# Patient Record
Sex: Female | Born: 1965
Health system: Southern US, Community
[De-identification: ages and names within clinical notes are randomized; demographics above are authoritative.]

## PROBLEM LIST (undated history)

## (undated) DIAGNOSIS — Z79899 Other long term (current) drug therapy: Secondary | ICD-10-CM

## (undated) DIAGNOSIS — G43909 Migraine, unspecified, not intractable, without status migrainosus: Secondary | ICD-10-CM

## (undated) DIAGNOSIS — T4145XA Adverse effect of unspecified anesthetic, initial encounter: Secondary | ICD-10-CM

## (undated) DIAGNOSIS — R1031 Right lower quadrant pain: Secondary | ICD-10-CM

## (undated) DIAGNOSIS — T8859XA Other complications of anesthesia, initial encounter: Secondary | ICD-10-CM

## (undated) DIAGNOSIS — N200 Calculus of kidney: Secondary | ICD-10-CM

## (undated) DIAGNOSIS — R112 Nausea with vomiting, unspecified: Secondary | ICD-10-CM

## (undated) DIAGNOSIS — I10 Essential (primary) hypertension: Secondary | ICD-10-CM

## (undated) DIAGNOSIS — Z9889 Other specified postprocedural states: Secondary | ICD-10-CM

## (undated) DIAGNOSIS — R1032 Left lower quadrant pain: Secondary | ICD-10-CM

## (undated) HISTORY — DX: Left lower quadrant pain: R10.32

## (undated) HISTORY — DX: Migraine, unspecified, not intractable, without status migrainosus: G43.909

## (undated) HISTORY — DX: Right lower quadrant pain: R10.31

## (undated) HISTORY — DX: Essential (primary) hypertension: I10

## (undated) HISTORY — PX: ABDOMINAL HYSTERECTOMY: SHX81

## (undated) HISTORY — DX: Calculus of kidney: N20.0

## (undated) HISTORY — DX: Other long term (current) drug therapy: Z79.899

## (undated) HISTORY — PX: OTHER SURGICAL HISTORY: SHX169

---

## 2001-02-26 ENCOUNTER — Other Ambulatory Visit: Admission: RE | Admit: 2001-02-26 | Discharge: 2001-02-26 | Payer: Self-pay | Admitting: Obstetrics and Gynecology

## 2001-06-04 ENCOUNTER — Ambulatory Visit (HOSPITAL_COMMUNITY): Admission: RE | Admit: 2001-06-04 | Discharge: 2001-06-04 | Payer: Self-pay | Admitting: Family Medicine

## 2001-06-04 ENCOUNTER — Encounter: Payer: Self-pay | Admitting: Family Medicine

## 2001-06-26 ENCOUNTER — Ambulatory Visit (HOSPITAL_COMMUNITY): Admission: RE | Admit: 2001-06-26 | Discharge: 2001-06-26 | Payer: Self-pay | Admitting: Obstetrics and Gynecology

## 2001-07-05 ENCOUNTER — Inpatient Hospital Stay (HOSPITAL_COMMUNITY): Admission: RE | Admit: 2001-07-05 | Discharge: 2001-07-07 | Payer: Self-pay | Admitting: Obstetrics and Gynecology

## 2003-03-26 ENCOUNTER — Other Ambulatory Visit: Admission: RE | Admit: 2003-03-26 | Discharge: 2003-03-26 | Payer: Self-pay | Admitting: Dermatology

## 2003-04-15 ENCOUNTER — Ambulatory Visit (HOSPITAL_COMMUNITY): Admission: RE | Admit: 2003-04-15 | Discharge: 2003-04-15 | Payer: Self-pay | Admitting: Family Medicine

## 2003-04-16 ENCOUNTER — Ambulatory Visit (HOSPITAL_COMMUNITY): Admission: RE | Admit: 2003-04-16 | Discharge: 2003-04-16 | Payer: Self-pay | Admitting: Urology

## 2003-05-06 ENCOUNTER — Ambulatory Visit (HOSPITAL_COMMUNITY): Admission: RE | Admit: 2003-05-06 | Discharge: 2003-05-06 | Payer: Self-pay | Admitting: Family Medicine

## 2005-09-13 ENCOUNTER — Ambulatory Visit (HOSPITAL_COMMUNITY): Admission: RE | Admit: 2005-09-13 | Discharge: 2005-09-13 | Payer: Self-pay | Admitting: Obstetrics and Gynecology

## 2005-10-20 ENCOUNTER — Ambulatory Visit (HOSPITAL_COMMUNITY): Admission: RE | Admit: 2005-10-20 | Discharge: 2005-10-20 | Payer: Self-pay | Admitting: Family Medicine

## 2005-11-25 ENCOUNTER — Encounter: Admission: RE | Admit: 2005-11-25 | Discharge: 2005-11-25 | Payer: Self-pay | Admitting: Orthopedic Surgery

## 2006-09-25 ENCOUNTER — Ambulatory Visit (HOSPITAL_COMMUNITY): Admission: RE | Admit: 2006-09-25 | Discharge: 2006-09-25 | Payer: Self-pay | Admitting: Obstetrics & Gynecology

## 2006-10-11 ENCOUNTER — Ambulatory Visit (HOSPITAL_COMMUNITY): Admission: RE | Admit: 2006-10-11 | Discharge: 2006-10-11 | Payer: Self-pay | Admitting: Obstetrics & Gynecology

## 2006-10-11 ENCOUNTER — Encounter: Payer: Self-pay | Admitting: Obstetrics & Gynecology

## 2006-11-23 ENCOUNTER — Encounter: Admission: RE | Admit: 2006-11-23 | Discharge: 2006-11-23 | Payer: Self-pay | Admitting: Obstetrics and Gynecology

## 2007-11-26 ENCOUNTER — Encounter: Admission: RE | Admit: 2007-11-26 | Discharge: 2007-11-26 | Payer: Self-pay | Admitting: Obstetrics and Gynecology

## 2008-09-09 DIAGNOSIS — N200 Calculus of kidney: Secondary | ICD-10-CM

## 2008-09-09 HISTORY — DX: Calculus of kidney: N20.0

## 2008-09-11 ENCOUNTER — Inpatient Hospital Stay (HOSPITAL_COMMUNITY): Admission: EM | Admit: 2008-09-11 | Discharge: 2008-09-12 | Payer: Self-pay | Admitting: Emergency Medicine

## 2008-09-14 ENCOUNTER — Emergency Department (HOSPITAL_COMMUNITY): Admission: EM | Admit: 2008-09-14 | Discharge: 2008-09-14 | Payer: Self-pay | Admitting: Emergency Medicine

## 2008-12-11 ENCOUNTER — Encounter: Admission: RE | Admit: 2008-12-11 | Discharge: 2008-12-11 | Payer: Self-pay | Admitting: Obstetrics and Gynecology

## 2009-10-08 ENCOUNTER — Emergency Department (HOSPITAL_COMMUNITY): Admission: EM | Admit: 2009-10-08 | Discharge: 2009-10-08 | Payer: Self-pay | Admitting: Emergency Medicine

## 2009-10-09 ENCOUNTER — Emergency Department (HOSPITAL_COMMUNITY): Admission: EM | Admit: 2009-10-09 | Discharge: 2009-10-09 | Payer: Self-pay | Admitting: Emergency Medicine

## 2009-12-15 ENCOUNTER — Encounter: Admission: RE | Admit: 2009-12-15 | Discharge: 2009-12-15 | Payer: Self-pay | Admitting: Obstetrics and Gynecology

## 2010-01-27 ENCOUNTER — Emergency Department (HOSPITAL_COMMUNITY): Admission: EM | Admit: 2010-01-27 | Discharge: 2010-01-27 | Payer: Self-pay | Admitting: Emergency Medicine

## 2010-05-02 ENCOUNTER — Encounter: Payer: Self-pay | Admitting: Obstetrics and Gynecology

## 2010-06-23 LAB — BASIC METABOLIC PANEL
Calcium: 9.4 mg/dL (ref 8.4–10.5)
Creatinine, Ser: 0.63 mg/dL (ref 0.4–1.2)
GFR calc Af Amer: >60 mL/min (ref >60)
GFR calc non Af Amer: >60 mL/min (ref >60)
Sodium: 139 mEq/L (ref 135–145)

## 2010-06-23 LAB — CBC
Hemoglobin: 13.8 g/dL (ref 12.0–15.0)
MCHC: 34.5 g/dL (ref 30.0–36.0)
Platelets: 179 10*3/uL (ref 150–400)
RBC: 4.38 MIL/uL (ref 3.87–5.11)

## 2010-06-23 LAB — DIFFERENTIAL
Basophils Relative: 0 % (ref 0–1)
Eosinophils Absolute: 0.2 10*3/uL (ref 0.0–0.7)
Monocytes Relative: 8 % (ref 3–12)
Neutro Abs: 3.9 10*3/uL (ref 1.7–7.7)
Neutrophils Relative %: 67 % (ref 43–77)

## 2010-06-27 LAB — BASIC METABOLIC PANEL
BUN: 9 mg/dL (ref 6–23)
CO2: 24 mEq/L (ref 19–32)
Calcium: 7.6 mg/dL — ABNORMAL LOW (ref 8.4–10.5)
Calcium: 8.9 mg/dL (ref 8.4–10.5)
Creatinine, Ser: 0.88 mg/dL (ref 0.4–1.2)
GFR calc Af Amer: >60 mL/min (ref >60)
GFR calc non Af Amer: >60 mL/min (ref >60)
GFR calc non Af Amer: >60 mL/min (ref >60)
Glucose, Bld: 98 mg/dL (ref 70–99)
Glucose, Bld: 104 mg/dL — ABNORMAL HIGH (ref 70–99)
Potassium: 2.9 mEq/L — ABNORMAL LOW (ref 3.5–5.1)
Sodium: 139 mEq/L (ref 135–145)
Sodium: 134 mEq/L — ABNORMAL LOW (ref 135–145)

## 2010-06-27 LAB — URINALYSIS, ROUTINE W REFLEX MICROSCOPIC
Bilirubin Urine: NEGATIVE
Leukocytes, UA: NEGATIVE
Nitrite: NEGATIVE
Specific Gravity, Urine: 1.02 (ref 1.005–1.030)
Urobilinogen, UA: 0.2 mg/dL (ref 0.0–1.0)
pH: 6 (ref 5.0–8.0)

## 2010-06-27 LAB — URINE MICROSCOPIC-ADD ON

## 2010-06-27 LAB — CBC
Hemoglobin: 14.6 g/dL (ref 12.0–15.0)
MCH: 32 pg (ref 26.0–34.0)
MCHC: 35 g/dL (ref 30.0–36.0)
RDW: 11.9 % (ref 11.5–15.5)

## 2010-06-27 LAB — DIFFERENTIAL
Eosinophils Absolute: 0 10*3/uL (ref 0.0–0.7)
Lymphs Abs: 0.8 10*3/uL (ref 0.7–4.0)
Monocytes Relative: 17 % — ABNORMAL HIGH (ref 3–12)
Neutrophils Relative %: 59 % (ref 43–77)

## 2010-07-19 LAB — DIFFERENTIAL
Basophils Absolute: 0 10*3/uL (ref 0.0–0.1)
Basophils Absolute: 0 10*3/uL (ref 0.0–0.1)
Basophils Relative: 0 % (ref 0–1)
Lymphocytes Relative: 3 % — ABNORMAL LOW (ref 12–46)
Lymphs Abs: 0.3 10*3/uL — ABNORMAL LOW (ref 0.7–4.0)
Neutro Abs: 11.6 10*3/uL — ABNORMAL HIGH (ref 1.7–7.7)
Neutro Abs: 4.9 10*3/uL (ref 1.7–7.7)
Neutrophils Relative %: 64 % (ref 43–77)
Neutrophils Relative %: 95 % — ABNORMAL HIGH (ref 43–77)

## 2010-07-19 LAB — COMPREHENSIVE METABOLIC PANEL
Alkaline Phosphatase: 62 U/L (ref 39–117)
BUN: 13 mg/dL (ref 6–23)
CO2: 25 mEq/L (ref 19–32)
Chloride: 109 mEq/L (ref 96–112)
Glucose, Bld: 105 mg/dL — ABNORMAL HIGH (ref 70–99)
Potassium: 3.2 mEq/L — ABNORMAL LOW (ref 3.5–5.1)
Total Bilirubin: 0.6 mg/dL (ref 0.3–1.2)

## 2010-07-19 LAB — POCT I-STAT 4, (NA,K, GLUC, HGB,HCT)
Glucose, Bld: 103 mg/dL — ABNORMAL HIGH (ref 70–99)
Potassium: 3.1 mEq/L — ABNORMAL LOW (ref 3.5–5.1)

## 2010-07-19 LAB — URINALYSIS, ROUTINE W REFLEX MICROSCOPIC
Glucose, UA: NEGATIVE mg/dL
Glucose, UA: NEGATIVE mg/dL
Protein, ur: NEGATIVE mg/dL
Protein, ur: NEGATIVE mg/dL
Specific Gravity, Urine: 1.015 (ref 1.005–1.030)
pH: 7 (ref 5.0–8.0)

## 2010-07-19 LAB — BASIC METABOLIC PANEL
BUN: 8 mg/dL (ref 6–23)
Calcium: 8.5 mg/dL (ref 8.4–10.5)
Creatinine, Ser: 0.96 mg/dL (ref 0.4–1.2)
GFR calc non Af Amer: >60 mL/min (ref >60)
Potassium: 3.1 mEq/L — ABNORMAL LOW (ref 3.5–5.1)

## 2010-07-19 LAB — CBC
HCT: 35.7 % — ABNORMAL LOW (ref 36.0–46.0)
HCT: 36.2 % (ref 36.0–46.0)
Hemoglobin: 12.9 g/dL (ref 12.0–15.0)
Platelets: 165 10*3/uL (ref 150–400)
RDW: 12.2 % (ref 11.5–15.5)
WBC: 12.3 10*3/uL — ABNORMAL HIGH (ref 4.0–10.5)
WBC: 7.7 10*3/uL (ref 4.0–10.5)

## 2010-07-19 LAB — URINE CULTURE
Colony Count: NO GROWTH
Culture: NO GROWTH

## 2010-07-19 LAB — URINE MICROSCOPIC-ADD ON

## 2010-07-19 LAB — PREGNANCY, URINE: Preg Test, Ur: NEGATIVE

## 2010-08-24 NOTE — Op Note (Signed)
Angela Salas, Angela Salas               ACCOUNT NO.:  [id number]   MEDICAL RECORD NO.:  [id number]          PATIENT TYPE:  AMB   LOCATION:  DAY                           FACILITY:  [facility]   PHYSICIAN:  Lazaro Arms, M.D.   DATE OF BIRTH:  [birth year]   DATE OF PROCEDURE:  DATE OF DISCHARGE:                               OPERATIVE REPORT   PREOPERATIVE DIAGNOSES:  1. Right adnexal mass by ultrasound.  2. Right lower quadrant pain and back pain.   POSTOPERATIVE DIAGNOSES:  1. Right adnexal mass by ultrasound.  2. Right lower quadrant pain and back pain.   PROCEDURE:  Laparoscopic right salpingo-oophorectomy.   SURGEON:  Lazaro Arms, M.D.   ANESTHESIA:  General endotracheal.   FINDINGS:  Patient probably had a ruptured hemorrhagic corpus luteum  cyst from the interval change from her ultrasound done here at [facility], but it was free, immobile.  There was no evidence of malignancy or  other problems in the peritoneal cavity.  No ascites.  No studding.  They have been removed intact in total.   DESCRIPTION OF OPERATION:  Patient was taken to the operating room,  placed in the supine position, underwent general endotracheal  anesthesia.  Placed in the dorsal lithotomy position, prepped and draped  in the usual sterile fashion.  Incision was made in the umbilicus.  A  Veress needle was used and placed into the peritoneal cavity on one pass  without difficulty.  The peritoneal cavity was insufflated.  A nonbladed  11 mm trocar was used, and under direct visualization was placed into  the peritoneal cavity using the video laparoscope without difficulty  with one pass.  The incision was then made two fingerbreadths above the  pubis just to the left of the midline because of her previous midline  incision and also in the right lower quadrant, both under direct  visualization without difficulty.  The ovary was grasped.  The harmonic  scalpel was used.  The infundibulopelvic ligament was  taken down using  the harmonic scalpel.  It was taken off the pelvic sidewall without  difficulty, well away from the ureter and major vessels.  The bowel, in  addition, was not in the way.  There was no bleeding at all.  The pelvis  was irrigated.  The ovary was removed using Endocatch.  I changed to the  5 mm scope.  Then it was removed intact without difficulty.  The  incision had to be made a little bit larger in order to accommodate  removing the ovary.  Again, the pelvis was completely hemostatic.  The  instruments were removed.  The gas was allowed to escape.  The umbilical  fascia was  closed with 0 Vicryl suture.  Subcutaneous sutures were placed with 3-0  Vicryl.  All three incisions were closed to skin staples.  The patient  tolerated the procedure well.  She experienced minimal blood loss, was  taken to the recovery room in good, stable condition.  All counts were  correct x3.      Lazaro Arms, M.D.  Electronically Signed     LHE/MEDQ  D:  10/11/2006  T:  10/11/2006  Job:  [id number]

## 2010-08-24 NOTE — H&P (Signed)
Angela Salas, Angela Salas               ACCOUNT NO.:  [id number]   MEDICAL RECORD NO.:  [id number]          PATIENT TYPE:  INP   LOCATION:  A309                          FACILITY:  [facility]   PHYSICIAN:  Dennie Maizes, M.D.   DATE OF BIRTH:  11-08-1965   DATE OF ADMISSION:  09/11/2008  DATE OF DISCHARGE:  LH                              HISTORY & PHYSICAL   CHIEF COMPLAINT:  Severe left flank pain radiating to the front.   HISTORY OF PRESENT ILLNESS:  This 45 year old female is known to me from  prior evaluation.  She has a history of recurrent urolithiasis.  She was  evaluated for renal colic about 10 years ago.   She had sudden onset of severe left flank pain radiating to the front  yesterday.  She came to the emergency room for further evaluation.  She  did not have any fever, chills, voiding difficulty, or gross hematuria.  Urinalysis revealed microhematuria and further evaluation was done with  a noncontrast CT scan of abdomen and pelvis.  This revealed bilateral  nonobstructing small renal calculi.  There is a 4-mm size proximal upper  ureteral calculus on the left side with hydroureter and hydronephrosis.  This also perinephric stranding.  The patient's pain was not adequately  controlled in the emergency room.  She has been admitted to the hospital  for pain control as well as further treatment.   PAST MEDICAL HISTORY:  History of recurrent urolithiasis status post  abdominal hysterectomy with left salpingo-oophorectomy in 2003, status  post right salpingo-oophorectomy in 2008.   MEDICATIONS:  None.   ALLERGIES:  None.   EXAMINATION:  The patient is comfortable after receiving IV Dilaudid.  HEAD, EYES, EARS, NOSE, AND THROAT:  Normal.  LUNGS:  Clear to auscultation.  HEART:  Regular rate and rhythm.  No murmurs.  ABDOMEN:  Soft.  No palpable flank mass.  Moderate left costovertebral  angle tenderness was noted.  Bladder not palpable.  No suprapubic  tenderness.   ADMISSION  LABS:  Sodium 141, potassium 3.2, chloride 109, CO2 25,  glucose 105, BUN 13, creatinine 0.75, calcium 9.1.  CBC:  WBC 7.7,  hemoglobin 12.9, hematocrit 35.7.  Urinalysis:  Moderate blood, nitrate  negative, leukocyte esterase negative.  Microscopic examination:  RBC 3-  6/hpf.   IMPRESSION:  Left renal colic, left upper ureteral calculi with  obstruction, left hydronephrosis, bilateral nonobstructing small renal  calculi.   PLAN:  1. Admit the patient for IV fluids and Dilaudid PCA.  2. Strain all urine for stones.  3. Flomax for stone passage.  4. I discussed with the patient regarding management options.  If she      has persistent pain, she may need cystoscopy, left retrograde      pyelogram, ureteroscopy, stone extraction, and stent placement.  An      x-ray of the KUB area will be repeated on September 12, 2008, for stone      localization.      Dennie Maizes, M.D.  Electronically Signed     SK/MEDQ  D:  09/11/2008  T:  09/11/2008  Job:  [id number]   cc:   Donna Bernard, M.D.  Fax: [phone]

## 2010-08-24 NOTE — H&P (Signed)
NAME:  Angela Salas, Angela Salas               ACCOUNT NO.:  [id number]   MEDICAL RECORD NO.:  [id number]          PATIENT TYPE:  AMB   LOCATION:  DAY                           FACILITY:  [facility]   PHYSICIAN:  Lazaro Arms, M.D.   DATE OF BIRTH:  Aug 10, 1965   DATE OF ADMISSION:  10/11/2006  DATE OF DISCHARGE:  LH                              HISTORY & PHYSICAL   HISTORY OF PRESENT ILLNESS:  The patient is a 45 year old white female  gravida 2, para 2, status post a TAH LSO in the past who has a 4.9 x 3.9  cm right complex adnexal mass arising from the right ovary and it is  partially cystic and partially solid.  Her CA-125 is 13.4.  The patient  states that she has had pelvic pain in the right lower quadrant for  quite some time as well as her back and some occasional dyspareunia.  Because of the complex nature of it even though it has a CA-125 that is  in the benign range, because of her pain, we are going to proceed with a  laparoscopic RSO.   PAST MEDICAL HISTORY:  Negative.   PAST SURGICAL HISTORY:  TAH LSO.   PAST OBSTETRICAL HISTORY:  She has had two vaginal deliveries.   CURRENT MEDICATIONS:  None.   REVIEW OF SYSTEMS:  As per HPI.   PHYSICAL EXAMINATION:  VITAL SIGNS:  Weight is 136 pounds, blood  pressure 122/82.  HEENT:  Unremarkable.  Thyroid is normal.  LUNGS:  Clear.  HEART:  Regular rate and rhythm without murmurs, rubs, or gallops.  BREASTS:  Without mass, discharge, or skin changes.  ABDOMEN:  Benign without hepatosplenomegaly or masses.  PELVIC:  Normal external genitalia.  Vagina is pink and moist without  discharge.  There are no midline or adnexal masses palpable.  On the  right side, she is tender, however.  EXTREMITIES:  Warm with no edema.  NEUROLOGY:  Grossly intact.   IMPRESSION:  1. Complex right adnexal mass.  2. Right lower quadrant pain and right back pain.   PLAN:  The patient is admitted for a laparoscopic RSO.  She understands  the risks, benefits,  indications, alternatives, and will proceed.      Lazaro Arms, M.D.  Electronically Signed     LHE/MEDQ  D:  10/10/2006  T:  10/10/2006  Job:  [id number]

## 2010-08-24 NOTE — Group Therapy Note (Signed)
NAMEUBAH, RADKE               ACCOUNT NO.:  [id number]   MEDICAL RECORD NO.:  [id number]          PATIENT TYPE:  INP   LOCATION:  A309                          FACILITY:  [facility]   PHYSICIAN:  Dennie Maizes, M.D.   DATE OF BIRTH:  [birth year]   DATE OF PROCEDURE:  09/12/2008  DATE OF DISCHARGE:                                 PROGRESS NOTE   DIAGNOSES:  1. Left upper ureteral calculus with obstruction.  2. Left renal colic.  3. Left hydronephrosis.   Ms. Loney has not passed the stone.  She still has significant severe  pain.  She is on Dilaudid PCA with good pain control.  No history of  fevers or chills.  No voiding difficulty.  X-ray of the KUB area done  today revealed a 5-mm sized stone  on the left side at L3 level.  Examination of the abdomen is soft.  No palpable flank mass or CVA  tenderness.   PLAN:  Discussed with the patient regarding management options.  Recommended stent placement, and she was agreeable.  Cystoscopy and left  retrograde pyelogram with left ureteral stent placement will be done  today.  The obstructing stone will be treated with ESL as an outpatient.  I discussed with the patient regarding the diagnosis, operative details  ,outcome, possible risks and complications, and she has agreed for the  procedure to be done.      Dennie Maizes, M.D.  Electronically Signed     SK/MEDQ  D:  09/12/2008  T:  09/12/2008  Job:  [id number]

## 2010-08-24 NOTE — Group Therapy Note (Signed)
NAMELORALIE, MALTA               ACCOUNT NO.:  [id number]   MEDICAL RECORD NO.:  [id number]          PATIENT TYPE:  INP   LOCATION:  A309                          FACILITY:  [facility]   PHYSICIAN:  Dennie Maizes, M.D.   DATE OF BIRTH:  1965-07-25   DATE OF PROCEDURE:  09/12/2008  DATE OF DISCHARGE:                                 PROGRESS NOTE   Angela Salas has relief of pain now.  Has not passed any stone in the  urine.  A KUB done this morning revealed a 5 mm sized stone at  the L3  level.  The patient has relief of pain and she wants to cancel the stent  placement and go home.  Her potassium was 3.0 yesterday.  Her potassium  is 3.1 today.  We will replace her potassium with potassium supplements.  The patient is also nauseated.   IMPRESSION:  Left upper ureteral calculus obstruction.   PLAN:  The patient will be discharged today.   DISCHARGE MEDICATIONS:  1. Flomax 0.4 mg p.o. q.h.s.  2. Phenergan suppository 25 mg one PR q.8 hours p.r.n. nausea.  3. Percocet 5/325 one p.o. q.8 hours p.r.n. pain #20.   The patient will be seen in the office next week and if necessary ESL of  the stone will be scheduled as an outpatient.      Dennie Maizes, M.D.  Electronically Signed     SK/MEDQ  D:  09/12/2008  T:  09/12/2008  Job:  [id number]

## 2010-08-27 NOTE — Op Note (Signed)
[facility]  Patient:    Angela Salas, Angela Salas Visit Number: [id number] MRN: [id number]          Service Type: MED Location: 4A A420 01 Attending Physician:  Tilda Burrow Dictated by:   Christin Bach, M.D. Proc. Date: 07/05/01 Admit Date:  07/05/2001                             Operative Report  PREOPERATIVE DIAGNOSIS:  Symptomatic left ovarian cyst.  Severe dysmenorrhea, rule out ovarian malignancy.  POSTOPERATIVE DIAGNOSIS:  Symptomatic left ovarian cyst.  Severe dysmenorrhea, rule out ovarian malignancy.  Benign left ovarian cyst adenoma.  OPERATION/PROCEDURE:  1. Total abdominal hysterectomy.  2. Bilateral salpingo-oophorectomy.  SURGEON:  Christin Bach, M.D.  ASSISTANTGeralynn Ochs, CST  ANESTHESIA:  General.  COMPLICATIONS:  None.  ESTIMATED BLOOD LOSS:  100 cc.  FINDINGS:  A 4 cm left ovarian cyst, frozen section interpreted as benign cystadenoma.  DESCRIPTION OF PROCEDURE:  The patient was taken to the operating room and prepped and draped for lower abdominal surgery with Foley catheter placed and vaginal prepping preformed.  Vertical lower abdominal incision performed approximately 12 cm in length, from symphysis pubis and halfway to the umbilicus.  There was easy dissection through to the fascia which was opened vertically as well, and the peritoneum easily identified, elevated, opened bluntly, and inspected.  Irrigation and cytology specimen was taken and put to the side.  Bowel was packed away.  There were some small adhesions at the pelvic brim, slightly more than is usually the case, some of which required freeing up in order to pack the bowel away.  Attention to the left ovary, identified the irregular cystic nature to the left ovary.  We isolated the utero-ovarian ligament by opening the broad ligament, making a window through just below the utero-ovarian ligament and doubly cross clamping this ligament.  It was then  transected and doubly ligated.  The infundibulopelvic ligament on the left side was isolated, clamped, cut, and suture ligated.  The ureter was well out of the surgical area.  This specimen was sent down for frozen section.  It subsequently, later in the case, returned as benign cystadenoma.  Meanwhile we proceeded with hysterectomy by taking down round ligaments bilaterally, developing bladder flap anteriorly with ease, isolating the utero-ovarian ligament on the left side, doubly clamping it, and transecting it with suture ligature, using #0 chromic.  The uterus was quite mobile, small in size without obvious abnormalities.  Uterine vessels were skeletonized on either side, cross clamped with curved Heaney clamp, transected, and suture ligated with #0 chromic.  Upper and lower cardinal ligaments were taken down in serial fashion with easy removal of the cardinal ligamental attachments and suture ligature with #0 chromic.  A stab incision in the anterior cervical vaginal fornix was performed and the cervix amputated off of the vaginal cuff.  Aldrich stitches were placed in each lateral vaginal angle, and tagged.  The cuff was closed in the midline beginning posteriorly which pulled the uterosacral ligaments into the cuff closure nicely.  The cuff remained more mobile than usual.  The patient then had irrigation of the pelvis, confirmation of hemostasis, 2-0 chromic interrupted for reapproximation of the bladder flap, followed by irrigation of the abdomen, removal of laparotomy equipment, 2-0 chromic closure of the anterior peritoneum, #0 Prolene of the fascia incision and 2-0 plain interrupted for reapproximation of the subcu fatty tissue and staple closure  of the skin.  ESTIMATED BLOOD LOSS:  150 cc maximum.  The patient tolerated the procedure well and is going to the recovery room in good condition. Dictated by:   Christin Bach, M.D. Attending Physician:  Tilda Burrow DD:   07/06/01 TD:  07/07/01 Job: [id number]

## 2010-08-27 NOTE — Discharge Summary (Signed)
[facility]  Patient:    [name], [name] Visit Number: [id number] MRN: [id number]          Service Type: MED Location: 4A A420 01 Attending Physician:  Tilda Burrow Dictated by:   Duane Lope, M.D. Admit Date:  07/05/2001 Discharge Date: 07/07/2001                             Discharge Summary  DISCHARGE DIAGNOSES: 1. Status post abdominal hysterectomy with a left salpingo-oophorectomy. 2. Severe dysmenorrhea, menometrorrhagia, and a symptomatic complex left    ovarian cyst.  PROCEDURES:  Abdominal hysterectomy with left salpingo-oophorectomy.  Surgeon was Dr. Emelda Fear.  HISTORY OF PRESENT ILLNESS:  Please refer to the transcribed history and physical and the operative note for details of admission to the hospital.  HOSPITAL COURSE:  The patient was admitted after surgery, which went well. 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  Postoperatively the patient did well.  On postoperative day #1 hemoglobin and hematocrit were 11.8 and 33.3.  She remained afebrile throughout the hospital course.  She tolerated clear liquids, full liquids, and then regular diet.  She voided without symptoms, ambulated without symptoms.  Her incision was a vertical incision.  It was clean, dry, and intact at the time of discharge, and her abdominal exam was benign.  She was discharged on the afternoon of postoperative day #2 in good and stable condition, to follow up in the office next Friday to have her staples removed. She was given Tylox and Motrin for pain.  She was given instructions and precautions for return prior to that time.Dictated by:   Duane Lope, M.D.  Attending Physician:  Tilda Burrow DD:  07/07/01 TD:  07/07/01 Job: [id number]

## 2010-08-27 NOTE — Discharge Summary (Signed)
Angela Salas, Angela Salas               ACCOUNT NO.:  [id number]   MEDICAL RECORD NO.:  [id number]          PATIENT TYPE:  INP   LOCATION:  A309                          FACILITY:  [facility]   PHYSICIAN:  Dennie Maizes, M.D.   DATE OF BIRTH:  11/13/1965   DATE OF ADMISSION:  09/11/2008  DATE OF DISCHARGE:  06/04/2010LH                               DISCHARGE SUMMARY   FINAL DIAGNOSES:  Left upper ureteral calculus obstruction, left renal  colic, left hydronephrosis, bilateral nonobstructing small renal  calculi.   OPERATIVE PROCEDURE:  None.   COMPLICATIONS:  None.   DISCHARGE SUMMARY:  This 45 year old female is known to me from prior  evaluation.  She has a history of recurrent urolithiasis.  She was  evaluated for her renal colic about 10 years ago.   She had sudden onset of severe left flank pain radiating to the front  since September 10, 2008.  She came to emergency room at [facility]  for further evaluation.  There is no history of fever, chills, voiding  difficulty, or gross hematuria.  Urinalysis revealed microhematuria.  Further evaluation with a noncontrast CT scan of abdomen and pelvis.  This revealed bilateral nonobstructing small renal calculi.  There is a  4-mm size left upper ureteral calculus with obstruction and  hydronephrosis.  There is also perinephric stranding.  The patient's  pain was not adequately controlled in the emergency room.  She was  admitted to the hospital for pain control as well as further treatment.   PAST MEDICAL HISTORY:  1. History of recurrent urolithiasis.  2. Status post abdominal hysterectomy plus left salpingo-oophorectomy      in 2003.  3. Status post right salpingo-oophorectomy in 2008.   MEDICATIONS:  None.   ALLERGIES:  None.   PHYSICAL EXAMINATION:  GENERAL:  The patient to be comfortable after  receiving IV Dilaudid.  HEAD, EYES, EARS, NOSE, and THROAT:  Normal.  LUNGS:  Clear to auscultation.  HEART:  Regular rate and  rhythm.  No murmurs.  ABDOMEN:  Soft.  No palpable flank mass.  Moderate left costovertebral  angle tenderness was noted.  Bladder was not palpable.  No suprapubic  tenderness.   ADMISSION LABS:  Sodium 141, potassium 3.3, chloride 109, CO2 22,  glucose 105, BUN 13, creatinine 0.75, and calcium 9.1.  CBC:  WBC 7.7,  hemoglobin 12.9, hematocrit 35.7.  Urinalysis revealed moderate blood,  nitrite negative, leukocyte esterase negative.  Microscopic examination  rbcs' 3-6 high-powered field.   The patient was admitted to the hospital, treated with IV fluids and  Dilaudid PCA.  She was started on Flomax for stone passage.  She did not  pass the stone during the hospitalization.  However, she had good pain  relief.  I discussed with the patient regarding left ureteral stent  placement and she wanted to be observed.  The patient was discharged and  sent home on September 12, 2008.  She was given Flomax 0.4 mg p.o. nightly,  Percocet 5/325 one p.o. q.8 h. p.r.n. pain, Phenergan suppositories for  relief of nausea, and K-Dur for replacement  of potassium.  She will be  reviewed in the office and lithotripsy of the left upper ureteral  calculi will be done as an outpatient.      Dennie Maizes, M.D.  Electronically Signed     SK/MEDQ  D:  09/29/2008  T:  09/30/2008  Job:  [id number]   cc:   Donna Bernard, M.D.  Fax: [phone]

## 2010-08-27 NOTE — H&P (Signed)
[facility]  Patient:    Angela Salas, Angela Salas Visit Number: [id number] MRN: [id number]          Service Type: OUT Location: RAD Attending Physician:  Zerita Boers Dictated by:   Christin Bach, M.D. Admit Date:  06/26/2001 Discharge Date: 06/26/2001                           History and Physical  ADMITTING DIAGNOSES: 1. Symptomatic left ovarian cyst. 2. Severe dysmenorrhea.  HISTORY OF PRESENT ILLNESS:  This is a 45 year old female, gravida 2, para 2, last menstrual period June 11, 2001, who has been evaluated in our office for longstanding history of lower back and pelvic pain.  She has some complaints of perceived abdominal bloating.  Pelvic ultrasound was performed which shows no evidence of ascites, but does identify _____ echogenic cyst, 4.8 x 3.4 x 3.6 cm in the left ovary.  There is a question of calcifications in the wall of the cyst.  There is no free pelvic fluid.  The patient had a CA-125 level which was normal at 16.2.  The patient is completely uninterested in continued observations of this suspected benign cyst due to the painful discomfort in the left side and through to the left back which has been existing from some time.  The patient has significant apprehensions regarding the family history of multiple non-Gyn cancers.  She has had extensive explanations of low association between malignancies, non-Gyn malignancies, and ovarian tumor, but wishes to proceed with surgery.  An additional factor is the patients severe incapacitating periods which she describes as horrible, causing her to be incapacitated for two to three days per cycle.  She is opposed to continued use of oral contraceptives for control, due to symptoms while on OCs.  After discussion of pros and cons of the procedure, plans are to proceed with hysterectomy and removal of left tube and ovary.  PAST MEDICAL HISTORY:  History of chronic hematuria ________ years,  not receiving urologic evaluation to date, and chronic back pain since the late 1990s.  ALLERGIES:  BENADRYL.  MEDICATIONS:  Oral contraceptives only.  PHYSICAL EXAMINATION:  GENERAL:  Reveals a slim, fatigued, generally healthy Caucasian female, alert and oriented x3.  HEENT:  Pupils are equal, round and reactive.  Extraocular movements intact.  NECK:  Supple, trachea midline.  CHEST:  Clear to auscultation.  ABDOMEN:  Nontender.  GENITALIA:  External genitalia appropriate for age.  Cervix multiparous, recent Pap smear class 1, tenderness in the left adnexa and mobile.  Uterus small, right adnexa normal to palpation at this time.  PLAN:  Abdominal hysterectomy and left salpingo-oophorectomy on July 05, 2001. Dictated by:   Christin Bach, M.D. Attending Physician:  Zerita Boers DD:  07/05/01 TD:  07/05/01 Job: [id number]

## 2010-08-27 NOTE — H&P (Signed)
[facility]  Patient:    Angela Salas, Angela Salas Visit Number: [id number] MRN: [id number]          Service Type: OUT Location: RAD Attending Physician:  Zerita Boers Dictated by:   Christin Bach, M.D. Admit Date:  06/26/2001 Discharge Date: 06/26/2001                           History and Physical  INCOMPLETE  ADMITTING DIAGNOSES: 1. Left adnexal cyst, symptomatic. 2. Severe dysmenorrhea.  HISTORY OF PRESENT ILLNESS:  This is a 45 year old female, gravida 2, para 2, currently Dictated by:   Christin Bach, M.D. Attending Physician:  Zerita Boers DD:  07/05/01 TD:  07/05/01 Job: [id number]

## 2010-12-08 ENCOUNTER — Other Ambulatory Visit: Payer: Self-pay | Admitting: Obstetrics and Gynecology

## 2010-12-08 DIAGNOSIS — Z1231 Encounter for screening mammogram for malignant neoplasm of breast: Secondary | ICD-10-CM

## 2010-12-20 ENCOUNTER — Ambulatory Visit
Admission: RE | Admit: 2010-12-20 | Discharge: 2010-12-20 | Disposition: A | Discharge status: Home / Self Care | Payer: BC Managed Care – PPO | Source: Ambulatory Visit | Attending: Obstetrics and Gynecology | Admitting: Obstetrics and Gynecology

## 2010-12-20 DIAGNOSIS — Z1231 Encounter for screening mammogram for malignant neoplasm of breast: Secondary | ICD-10-CM

## 2011-01-26 LAB — COMPREHENSIVE METABOLIC PANEL
ALT: 22
Albumin: 4
Alkaline Phosphatase: 60
Calcium: 9.1
Potassium: 3.6
Sodium: 137
Total Protein: 6.3

## 2011-01-26 LAB — URINE MICROSCOPIC-ADD ON

## 2011-01-26 LAB — CBC
MCHC: 34.9
Platelets: 208
RDW: 12.1

## 2011-01-26 LAB — URINALYSIS, ROUTINE W REFLEX MICROSCOPIC
Glucose, UA: NEGATIVE
Ketones, ur: NEGATIVE
Leukocytes, UA: NEGATIVE
Nitrite: NEGATIVE
Specific Gravity, Urine: 1.015
pH: 7

## 2011-01-26 LAB — DIFFERENTIAL
Basophils Relative: 0
Eosinophils Relative: 2
Monocytes Absolute: 0.4
Monocytes Relative: 5
Neutrophils Relative %: 76

## 2011-01-26 LAB — HCG, QUANTITATIVE, PREGNANCY: hCG, Beta Chain, Quant, S: <2

## 2011-11-29 ENCOUNTER — Other Ambulatory Visit: Payer: Self-pay | Admitting: Obstetrics and Gynecology

## 2011-11-29 DIAGNOSIS — Z1231 Encounter for screening mammogram for malignant neoplasm of breast: Secondary | ICD-10-CM

## 2011-12-26 ENCOUNTER — Ambulatory Visit
Admission: RE | Admit: 2011-12-26 | Discharge: 2011-12-26 | Disposition: A | Discharge status: Home / Self Care | Payer: BC Managed Care – PPO | Source: Ambulatory Visit | Attending: Obstetrics and Gynecology | Admitting: Obstetrics and Gynecology

## 2011-12-26 DIAGNOSIS — Z1231 Encounter for screening mammogram for malignant neoplasm of breast: Secondary | ICD-10-CM

## 2012-09-25 ENCOUNTER — Other Ambulatory Visit: Payer: Self-pay | Admitting: Family Medicine

## 2012-09-25 NOTE — Telephone Encounter (Signed)
May give 3 refills total

## 2012-10-17 ENCOUNTER — Encounter: Payer: Self-pay | Admitting: Adult Health

## 2012-10-17 ENCOUNTER — Ambulatory Visit (INDEPENDENT_AMBULATORY_CARE_PROVIDER_SITE_OTHER): Payer: BC Managed Care – PPO | Admitting: Adult Health

## 2012-10-17 VITALS — BP 112/78 | HR 72 | Ht 64.5 in | Wt 140.0 lb

## 2012-10-17 DIAGNOSIS — I10 Essential (primary) hypertension: Secondary | ICD-10-CM

## 2012-10-17 DIAGNOSIS — Z9223 Personal history of estrogen therapy: Secondary | ICD-10-CM | POA: Insufficient documentation

## 2012-10-17 DIAGNOSIS — G43909 Migraine, unspecified, not intractable, without status migrainosus: Secondary | ICD-10-CM | POA: Insufficient documentation

## 2012-10-17 DIAGNOSIS — Z79899 Other long term (current) drug therapy: Secondary | ICD-10-CM | POA: Insufficient documentation

## 2012-10-17 DIAGNOSIS — Z01419 Encounter for gynecological examination (general) (routine) without abnormal findings: Secondary | ICD-10-CM

## 2012-10-17 DIAGNOSIS — Z79818 Long term (current) use of other agents affecting estrogen receptors and estrogen levels: Secondary | ICD-10-CM | POA: Insufficient documentation

## 2012-10-17 HISTORY — DX: Long term (current) use of other agents affecting estrogen receptors and estrogen levels: Z79.818

## 2012-10-17 HISTORY — DX: Other long term (current) drug therapy: Z79.899

## 2012-10-17 MED ORDER — ESTRADIOL 0.1 MG/24HR TD PTTW: 1.0000 | MEDICATED_PATCH | TRANSDERMAL | Status: DC

## 2012-10-17 MED ORDER — RIZATRIPTAN BENZOATE 5 MG PO TBDP: 5.0000 mg | ORAL_TABLET | ORAL | Status: DC | PRN

## 2012-10-17 NOTE — Patient Instructions (Addendum)
Physical in 1 year  Mammogram yearly Call prn  Colonoscopy at 50

## 2012-10-17 NOTE — Progress Notes (Signed)
Patient ID: Angela Salas, female   DOB: April 24, 1965, 47 y.o.   MRN: [id number] History of Present Illness: Angela Salas is a 47 year old white female, married in for her physical.   Current Medications, Allergies, Past Medical History, Past Surgical History, Family History and Social History were reviewed in [employer] [facility] medical record.     Review of Systems: Patient denies any blurred vision, shortness of breath, chest pain, abdominal pain, problems with bowel movements, urination, or intercourse. She has headaches and is moody at times.She has some hot flashes even with the patch.No joint pain.    Physical Exam:BP 112/78  Pulse 72  Ht 5' 4.5" (1.638 m)  Wt 140 lb (63.504 kg)  BMI 23.67 kg/m2 General:  Well developed, well nourished, no acute distress Skin:  Warm and dry Neck:  Midline trachea, normal thyroid Lungs; Clear to auscultation bilaterally Breast:  No dominant palpable mass, retraction, or nipple discharge Cardiovascular: Regular rate and rhythm Abdomen:  Soft, non tender, no hepatosplenomegaly Pelvic:  External genitalia is normal in appearance.  The vagina is normal in appearance. The cervix and uterus are absent.  No adnexal masses or tenderness noted. Rectal: Good sphincter tone, no polyps, or hemorrhoids felt.  Hemoccult negative. Extremities:  No swelling or varicosities noted Psych: Alert and cooperative,seems happy   Impression: Yearly gyn exam Hypertension Migraines Estrogen therapy    Plan: Physical in 1 year Rx minivelle 0.1 mg # 8 patches 1 2x weekly with 11 refills and Number of samples 6 boxes  Lot number [id number]     Exp date 5/15 Maxalt 5 mg 1 at start of headache may repeat in 2 hours, #10 Mammogram yearly Call me in follow up, on how new patch and Maxalt working

## 2012-12-03 ENCOUNTER — Other Ambulatory Visit: Payer: Self-pay

## 2012-12-03 DIAGNOSIS — Z1231 Encounter for screening mammogram for malignant neoplasm of breast: Secondary | ICD-10-CM

## 2012-12-07 ENCOUNTER — Other Ambulatory Visit: Payer: Self-pay | Admitting: Family Medicine

## 2012-12-22 ENCOUNTER — Encounter: Payer: Self-pay | Admitting: *Deleted

## 2012-12-24 ENCOUNTER — Encounter: Payer: Self-pay | Admitting: Family Medicine

## 2012-12-24 ENCOUNTER — Ambulatory Visit (INDEPENDENT_AMBULATORY_CARE_PROVIDER_SITE_OTHER): Payer: BC Managed Care – PPO | Admitting: Family Medicine

## 2012-12-24 VITALS — BP 118/78 | Ht 64.0 in | Wt 139.0 lb

## 2012-12-24 DIAGNOSIS — G43909 Migraine, unspecified, not intractable, without status migrainosus: Secondary | ICD-10-CM

## 2012-12-24 DIAGNOSIS — I10 Essential (primary) hypertension: Secondary | ICD-10-CM

## 2012-12-24 DIAGNOSIS — Z79899 Other long term (current) drug therapy: Secondary | ICD-10-CM

## 2012-12-24 DIAGNOSIS — Z0000 Encounter for general adult medical examination without abnormal findings: Secondary | ICD-10-CM

## 2012-12-24 MED ORDER — TOPIRAMATE 25 MG PO TABS: 25.0000 mg | ORAL_TABLET | Freq: Two times a day (BID) | ORAL | Status: DC

## 2012-12-24 MED ORDER — TRIAMTERENE-HCTZ 37.5-25 MG PO CAPS: 1.0000 | ORAL_CAPSULE | Freq: Every day | ORAL | Status: DC

## 2012-12-24 NOTE — Progress Notes (Signed)
  Subjective:    Patient ID: Angela Salas, female    DOB: 04-Nov-1965, 47 y.o.   MRN: [id number]  HPIHere for a blood pressure check up. Pt states her blood pressure has been good. She has a machine at home and checks about once every 2 weeks.  Bilateral hip pain for about 6 months. It worse at night when trying to sleep. She takes tylenol or aleve for the pain.trying Ibuprofen Pain to lower back and hips. Does not radiate down the leg.  Patient has frequent headaches. She describes them as frontal throughout the day cause slight nausea no vomiting with him couple times worse over the past few weeks does not wake her up at night. She has a history of migraines. Patient does not smoke. Past medical history benign family history benign  Review of Systems  Constitutional: Negative for fever, activity change and appetite change.  Respiratory: Negative for cough and shortness of breath.   Cardiovascular: Negative for chest pain and leg swelling.  Gastrointestinal: Negative for abdominal pain.  Musculoskeletal: Positive for back pain and arthralgias.       Objective:   Physical Exam  Constitutional: She appears well-developed and well-nourished.  HENT:  Head: Normocephalic.  Cardiovascular: Normal rate, regular rhythm and normal heart sounds.   Pulmonary/Chest: Effort normal and breath sounds normal. No respiratory distress. She has no wheezes.  Abdominal: Soft. She exhibits no distension.  Musculoskeletal: Normal range of motion.    Neurologic grossly normal no focal findings      Assessment & Plan:  #1 low back pain-I. believe that this is more likely inflammation in the ligaments in the sacroiliac joints into the lower back on both sides I believe patient should do range of motion exercises and core strengthening exercises handout was given. If ongoing trouble may need MRI but currently I don't believe that is necessary. May use ibuprofen when necessary  #2 HTN stable continue  current measures check metabolic 7  #3 migraines-frequent migraines I recommend Topamax 25 mg 1 nightly for the first 7 days then twice a day if not doing dramatically better over the next 3-4 weeks call us and we will increase the dose patient should followup within 6 months on this issue lipid profile ordered.

## 2012-12-31 ENCOUNTER — Ambulatory Visit
Admission: RE | Admit: 2012-12-31 | Discharge: 2012-12-31 | Disposition: A | Discharge status: Home / Self Care | Payer: BC Managed Care – PPO | Source: Ambulatory Visit

## 2012-12-31 DIAGNOSIS — Z1231 Encounter for screening mammogram for malignant neoplasm of breast: Secondary | ICD-10-CM

## 2013-01-02 ENCOUNTER — Encounter: Payer: Self-pay | Admitting: Family Medicine

## 2013-01-02 LAB — BASIC METABOLIC PANEL
BUN: 16 mg/dL (ref 6–23)
CO2: 28 mEq/L (ref 19–32)
Calcium: 9.1 mg/dL (ref 8.4–10.5)
Creat: 0.83 mg/dL (ref 0.50–1.10)
Glucose, Bld: 88 mg/dL (ref 70–99)
Sodium: 140 mEq/L (ref 135–145)

## 2013-01-02 LAB — LIPID PANEL: Total CHOL/HDL Ratio: 3.2 Ratio

## 2013-01-03 ENCOUNTER — Other Ambulatory Visit: Payer: Self-pay | Admitting: Obstetrics and Gynecology

## 2013-01-03 DIAGNOSIS — R928 Other abnormal and inconclusive findings on diagnostic imaging of breast: Secondary | ICD-10-CM

## 2013-01-16 ENCOUNTER — Ambulatory Visit
Admission: RE | Admit: 2013-01-16 | Discharge: 2013-01-16 | Disposition: A | Discharge status: Home / Self Care | Payer: BC Managed Care – PPO | Source: Ambulatory Visit | Attending: Obstetrics and Gynecology | Admitting: Obstetrics and Gynecology

## 2013-01-16 DIAGNOSIS — R928 Other abnormal and inconclusive findings on diagnostic imaging of breast: Secondary | ICD-10-CM

## 2013-02-12 ENCOUNTER — Encounter: Payer: Self-pay | Admitting: Family Medicine

## 2013-02-12 ENCOUNTER — Ambulatory Visit (INDEPENDENT_AMBULATORY_CARE_PROVIDER_SITE_OTHER): Payer: BC Managed Care – PPO | Admitting: Family Medicine

## 2013-02-12 VITALS — BP 110/72 | Ht 65.0 in | Wt 140.0 lb

## 2013-02-12 DIAGNOSIS — M25569 Pain in unspecified knee: Secondary | ICD-10-CM

## 2013-02-12 DIAGNOSIS — Z23 Encounter for immunization: Secondary | ICD-10-CM

## 2013-02-12 DIAGNOSIS — M25562 Pain in left knee: Secondary | ICD-10-CM

## 2013-02-12 NOTE — Progress Notes (Signed)
  Subjective:    Patient ID: Angela Salas, female    DOB: 1965-04-28, 47 y.o.   MRN: [id number]  Knee Pain  The incident occurred more than 1 week ago. There was no injury mechanism. The pain is present in the left knee. The quality of the pain is described as aching. The pain is at a severity of 7/10. The pain is moderate. The pain has been intermittent since onset. Associated symptoms include an inability to bear weight. She reports no foreign bodies present. Nothing aggravates the symptoms. She has tried ice and elevation for the symptoms. The treatment provided no relief.   She denies any particular injury. Just relates she's been having this problem for the past several weeks denies any other previous trouble. It does not lock or give way.  Review of Systems See above    Objective:   Physical Exam On exam and appears to have a Baker's cyst. Calf nontender neck ligaments and cartilage appear normal       Assessment & Plan:  Probable Baker cyst recommend ultrasound if Baker's cyst present referral to Dr. Romeo Apple await the results of the ultrasound anti-inflammatory when necessary

## 2013-02-12 NOTE — Patient Instructions (Signed)
Baker's Cyst  A Baker's cyst is a swelling that forms in the back of the knee. It is a sac-like structure. It is filled with the same fluid that is located in your knee. The fluid located in your knee is necessary because it lubricates the bones and cartilage. It allows them to move over each other more easily.  CAUSES   When the knee becomes injured or has soreness (inflammation) present, more fluid forms in the knee. When this happens, the joint lining is pushed out behind the knee and forms the baker's cyst. This cyst may also be caused by inflammation from arthritic conditions and infections.  DIAGNOSIS   A Baker's cyst is most often diagnosed with an ultrasound. This is a specialized picture (like an X-ray). It shows a picture by using sound waves. Sometimes a specialized x-ray called an MRI (magnetic resonance imaging) is used. This picks up other problems within a joint if an ultrasound alone cannot make the diagnosis. If the cyst came immediately following an injury, plain x-rays may be used to make a diagnosis.  TREATMENT   The treatment depends on the cause of the cyst. But most of these cysts are caused by an inflammation. Anti-inflammatory medications and rest often will get rid of the problem. If the cyst is caused by an infection, medications (antibiotics) will be prescribed to help this. Take the medications as directed. Refer to Home Care Instructions, below, for additional treatment suggestions.  HOME CARE INSTRUCTIONS    If the cyst was caused by an injury, for the first 24 hours, while lying down, keep the injured extremity elevated on 2 pillows.   For the first 24 hours while you are awake, apply ice bags (ice in a plastic bag with a towel around it to prevent frostbite to skin) 3-4 times per day for 15-20 minutes to the injured area. Then do as directed by your caregiver.   Only take over-the-counter or prescription medicines for pain, discomfort, or fever as directed by your  caregiver.  Persistent pain and inability to use the injured area for more than 2 to 3 days are warning signs indicating that you should see a caregiver for a follow-up visit as soon as possible. Persistent pain and swelling indicate that further evaluation, non-weight bearing (use of crutches as instructed), and/or further x-rays are needed. Make a follow-up appointment with your own caregiver.  If conservative measures (rest, medications and inactivity) do not help the problem get better, sometimes surgery for removal of the cyst is needed. Reasons for this may be that the cyst is pressing on nerves and/or vessels and causing problems which cannot wait for improvement with conservative treatment. If the problem is caused by injuries to the cartilage in the knee, surgery is often needed for treatment of that problem.  MAKE SURE YOU:    Understand these instructions.   Will watch your condition.   Will get help right away if you are not doing well or get worse.  Document Released: 03/28/2005 Document Revised: 06/20/2011 Document Reviewed: 11/14/2007  ExitCare Patient Information 2014 [employer].

## 2013-02-13 ENCOUNTER — Other Ambulatory Visit: Payer: Self-pay | Admitting: Family Medicine

## 2013-02-13 ENCOUNTER — Other Ambulatory Visit: Payer: Self-pay | Admitting: *Deleted

## 2013-02-13 DIAGNOSIS — M7122 Synovial cyst of popliteal space [Baker], left knee: Secondary | ICD-10-CM

## 2013-02-15 ENCOUNTER — Ambulatory Visit (HOSPITAL_COMMUNITY)
Admission: RE | Admit: 2013-02-15 | Discharge: 2013-02-15 | Disposition: A | Discharge status: Home / Self Care | Payer: BC Managed Care – PPO | Source: Ambulatory Visit | Attending: Family Medicine | Admitting: Family Medicine

## 2013-02-15 ENCOUNTER — Other Ambulatory Visit: Payer: Self-pay | Admitting: Family Medicine

## 2013-02-15 DIAGNOSIS — M7989 Other specified soft tissue disorders: Secondary | ICD-10-CM | POA: Insufficient documentation

## 2013-02-15 DIAGNOSIS — M7122 Synovial cyst of popliteal space [Baker], left knee: Secondary | ICD-10-CM

## 2013-02-15 DIAGNOSIS — M25569 Pain in unspecified knee: Secondary | ICD-10-CM | POA: Insufficient documentation

## 2013-03-21 ENCOUNTER — Ambulatory Visit (INDEPENDENT_AMBULATORY_CARE_PROVIDER_SITE_OTHER): Payer: BC Managed Care – PPO

## 2013-03-21 ENCOUNTER — Encounter: Payer: Self-pay | Admitting: Orthopedic Surgery

## 2013-03-21 ENCOUNTER — Ambulatory Visit (INDEPENDENT_AMBULATORY_CARE_PROVIDER_SITE_OTHER): Payer: BC Managed Care – PPO | Admitting: Orthopedic Surgery

## 2013-03-21 VITALS — BP 122/82 | Ht 64.0 in | Wt 139.0 lb

## 2013-03-21 DIAGNOSIS — M25569 Pain in unspecified knee: Secondary | ICD-10-CM

## 2013-03-21 DIAGNOSIS — M25562 Pain in left knee: Secondary | ICD-10-CM

## 2013-03-21 DIAGNOSIS — M549 Dorsalgia, unspecified: Secondary | ICD-10-CM

## 2013-03-21 MED ORDER — IBUPROFEN 800 MG PO TABS: 800.0000 mg | ORAL_TABLET | Freq: Three times a day (TID) | ORAL | Status: DC | PRN

## 2013-03-21 MED ORDER — GABAPENTIN 100 MG PO CAPS: ORAL_CAPSULE | ORAL | Status: DC

## 2013-03-21 NOTE — Progress Notes (Signed)
Patient ID: Angela Salas, female   DOB: 1965/09/16, 47 y.o.   MRN: [id number]  Chief Complaint  Patient presents with  . Knee Pain    Left knee pain, no injury.    History this is a 47 year old female active who presents with a three-month history of pain behind her left knee associated with bilateral hip pain and radiation into her left leg. She did get an ultrasound as part of her evaluation to check for popliteal system is negative. She complains of 7/10 constant pain worse with sitting and getting op. There was no trauma. She had a history of treatment for mild back pain in the remote past  No other treatments this point  She does not have any drug allergies. She does have hypertension she had a hysterectomy she takes triamterene and Vivelle. Family history of heart disease arthritis and diabetes. Social history married works in [employer] se[employer] does not smoke alcohol use 2-3 glasses of wine per week. Review of systems negative.  BP 122/82  Ht 5\' 4"  (1.626 m)  Wt 139 lb (63.05 kg)  BMI 23.85 kg/m2 This is a well-developed well-nourished female grooming and hygiene are excellent. She is oriented x3. Her mood and affect are normal. She has no gait abnormality.  Lumbar spine Tenderness across the lower part of the back. Normal muscle tone without increased tension.  Lower extremities these including left knee. No joint effusion. No palpable cyst in either popliteal fossa. Full range of motion hip knee and ankle with all joints stable. Motor exam normal. Skin normal.  Bilateral positive straight leg raises with radicular symptoms reproduced. Reflexes are 2+ equal and normal toes were downgoing. Normal sensation and pulses in both feet.  X-ray shows slight listhesis at L4-5 mild arthritic changes in the facet joints throughout the lumbar spine  Impression Encounter Diagnoses  Name Primary?  . Pain in joint, lower leg, left   . Back pain with radiation Yes    Recommend ibuprofen  and Neurontin since she has not had any medical treatment at this point. When she returns if she's not better we should perhaps consider therapy plus or minus MRI.

## 2013-03-21 NOTE — Patient Instructions (Signed)
Start new medications

## 2013-04-16 ENCOUNTER — Ambulatory Visit (INDEPENDENT_AMBULATORY_CARE_PROVIDER_SITE_OTHER): Payer: BC Managed Care – PPO | Admitting: Orthopedic Surgery

## 2013-04-16 ENCOUNTER — Encounter: Payer: Self-pay | Admitting: Orthopedic Surgery

## 2013-04-16 DIAGNOSIS — M549 Dorsalgia, unspecified: Secondary | ICD-10-CM

## 2013-04-16 NOTE — Patient Instructions (Addendum)
Resume neurontin and continue Ibuprofen as needed   Walking ok for exercise  If medication stops working or symptoms worsen or you experience numbness in your legs call the office to schedule a new appointment

## 2013-04-16 NOTE — Progress Notes (Signed)
Subjective:     Patient ID: Angela PeerAmanda A Salas, female   DOB: 05-19-1965, 48 y.o.   MRN: [id number]  Chief complaint recheck status post evaluation for knee pain with established diagnosis of lumbar spine disorder.  HPI Prior history Chief Complaint   Patient presents with   .  Knee Pain       Left knee pain, no injury.     History this is a 48 year old female active who presents with a three-month history of pain behind her left knee associated with bilateral hip pain and radiation into her left leg. She did get an ultrasound as part of her evaluation to check for popliteal system is negative. She complains of 7/10 constant pain worse with sitting and getting op. There was no trauma. She had a history of treatment for mild back pain in the remote past   Review of Systems The patient reports she had an episode of syncope after [place] [employer] [place]. She was evaluated by EMS her glucose 160 she does have a family history diabetes. She stopped taking Neurontin and noticed that her hip and leg pain got worse so she continued on ibuprofen.      Objective:   Physical Exam General appearance is normal, the patient is alert and oriented x3 with normal mood and affect.  She does have some pain with spinal flexion she can get her hands to about her ankle level. She has some tenderness in the lower back and soreness with that.  Reflexes remain normal muscle strength and motor tone is normal   X-ray shows slight listhesis at L4-5 mild arthritic changes in the facet joints throughout the lumbar spine  Impression Encounter Diagnoses   Name  Primary?   .  Pain in joint, lower leg, left     .  Back pain with radiation  Yes     Recommend ibuprofen and Neurontin since she has not had any medical treatment at this point. When she returns if she's not better we should perhaps consider therapy plus or minus MRI.     Assessment:     Encounter Diagnosis  Name Primary?  . Back pain with  radiation Yes        Plan:     I think she can resume her Neurontin and continue ibuprofen on an as-needed basis. She should walk for exercise. A center notes her primary care physician letting him know that she had this episode and with her family history of diabetes perhaps a hemoglobin A1c would be helpful.

## 2013-04-25 ENCOUNTER — Encounter: Payer: Self-pay | Admitting: Family Medicine

## 2013-04-25 ENCOUNTER — Ambulatory Visit (INDEPENDENT_AMBULATORY_CARE_PROVIDER_SITE_OTHER): Payer: BC Managed Care – PPO | Admitting: Family Medicine

## 2013-04-25 VITALS — BP 114/86 | Ht 65.0 in | Wt 144.0 lb

## 2013-04-25 DIAGNOSIS — R7309 Other abnormal glucose: Secondary | ICD-10-CM

## 2013-04-25 DIAGNOSIS — R739 Hyperglycemia, unspecified: Secondary | ICD-10-CM

## 2013-04-25 DIAGNOSIS — I10 Essential (primary) hypertension: Secondary | ICD-10-CM

## 2013-04-25 DIAGNOSIS — R55 Syncope and collapse: Secondary | ICD-10-CM

## 2013-04-25 LAB — POCT GLYCOSYLATED HEMOGLOBIN (HGB A1C): Hemoglobin A1C: 4.9

## 2013-04-25 MED ORDER — POTASSIUM CHLORIDE ER 10 MEQ PO TBCR: 10.0000 meq | EXTENDED_RELEASE_TABLET | Freq: Every day | ORAL | Status: DC

## 2013-04-25 MED ORDER — HYDROCHLOROTHIAZIDE 25 MG PO TABS: 25.0000 mg | ORAL_TABLET | Freq: Every day | ORAL | Status: DC

## 2013-04-25 NOTE — Progress Notes (Signed)
   Subjective:    Patient ID: Angela Salas, female    DOB: 28-Apr-1965, 48 y.o.   MRN: [id number]  HPI Patient is here today for a f/u.  She fainted 3 weeks ago after leaving a restaurant. EMS came and they released her. She said she felt fine after about 45 mins. Her blood sugar was 165. They were concerned about the possibility of diabetes. She hasn't had problems in the past with vasovagal like problems. No family history of premature death. See discussion below.  Review of Systems Denies chest tightness pressure pain just states she felt lightheaded and passed out briefly had seizure-like activity after passing out but this was associated with 3 separate passing out spells from passing out a friend setting her up she immediately passed out again friends that her back in once again she passed out when she contracted a few seconds then it went away EMS came there spent several minutes with her she was fine she did not want to go to the ER.    Objective:   Physical Exam Neck no masses eardrums normal throat is normal lungs are clear no crackles heart is regular abdomen is soft no guarding rebound or tenderness extremities no edema orthostatics negative       Assessment & Plan:  Probable vasovagal syncope-mechanisms to try to prevent again in the future were discussed in detail. No need to do any type of in-depth testing no need for referral to cardiology.  There is no sign of diabetes going on reassurance given proper diet discuss  Probable seizure-like activity after vasovagal syncope see discussion above followup if ongoing troubles

## 2013-04-25 NOTE — Patient Instructions (Signed)
DASH Diet  The DASH diet stands for "Dietary Approaches to Stop Hypertension." It is a healthy eating plan that has been shown to reduce high blood pressure (hypertension) in as little as 14 days, while also possibly providing other significant health benefits. These other health benefits include reducing the risk of breast cancer after menopause and reducing the risk of type 2 diabetes, heart disease, colon cancer, and stroke. Health benefits also include weight loss and slowing kidney failure in patients with chronic kidney disease.   DIET GUIDELINES  · Limit salt (sodium). Your diet should contain less than 1500 mg of sodium daily.  · Limit refined or processed carbohydrates. Your diet should include mostly whole grains. Desserts and added sugars should be used sparingly.  · Include small amounts of heart-healthy fats. These types of fats include nuts, oils, and tub margarine. Limit saturated and trans fats. These fats have been shown to be harmful in the body.  CHOOSING FOODS   The following food groups are based on a 2000 calorie diet. See your Registered Dietitian for individual calorie needs.  Grains and Grain Products (6 to 8 servings daily)  · Eat More Often: Whole-wheat bread, brown rice, whole-grain or wheat pasta, quinoa, popcorn without added fat or salt (air popped).  · Eat Less Often: White bread, white pasta, white rice, cornbread.  Vegetables (4 to 5 servings daily)  · Eat More Often: Fresh, frozen, and canned vegetables. Vegetables may be raw, steamed, roasted, or grilled with a minimal amount of fat.  · Eat Less Often/Avoid: Creamed or fried vegetables. Vegetables in a cheese sauce.  Fruit (4 to 5 servings daily)  · Eat More Often: All fresh, canned (in natural juice), or frozen fruits. Dried fruits without added sugar. One hundred percent fruit juice (½ cup [237 mL] daily).  · Eat Less Often: Dried fruits with added sugar. Canned fruit in light or heavy syrup.  Lean Meats, Fish, and Poultry (2  servings or less daily. One serving is 3 to 4 oz [85-114 g]).  · Eat More Often: Ninety percent or leaner ground beef, tenderloin, sirloin. Round cuts of beef, chicken breast, turkey breast. All fish. Grill, bake, or broil your meat. Nothing should be fried.  · Eat Less Often/Avoid: Fatty cuts of meat, turkey, or chicken leg, thigh, or wing. Fried cuts of meat or fish.  Dairy (2 to 3 servings)  · Eat More Often: Low-fat or fat-free milk, low-fat plain or light yogurt, reduced-fat or part-skim cheese.  · Eat Less Often/Avoid: Milk (whole, 2%). Whole milk yogurt. Full-fat cheeses.  Nuts, Seeds, and Legumes (4 to 5 servings per week)  · Eat More Often: All without added salt.  · Eat Less Often/Avoid: Salted nuts and seeds, canned beans with added salt.  Fats and Sweets (limited)  · Eat More Often: Vegetable oils, tub margarines without trans fats, sugar-free gelatin. Mayonnaise and salad dressings.  · Eat Less Often/Avoid: Coconut oils, palm oils, butter, stick margarine, cream, half and half, cookies, candy, pie.  FOR MORE INFORMATION  The Dash Diet Eating Plan: www.dashdiet.org  Document Released: 03/17/2011 Document Revised: 06/20/2011 Document Reviewed: 03/17/2011  Exit[employer]® Patient Information ©2014 [employer].

## 2013-04-26 DIAGNOSIS — R55 Syncope and collapse: Secondary | ICD-10-CM | POA: Insufficient documentation

## 2013-11-25 ENCOUNTER — Other Ambulatory Visit: Payer: Self-pay | Admitting: Adult Health

## 2013-12-12 ENCOUNTER — Other Ambulatory Visit: Payer: Self-pay

## 2013-12-12 DIAGNOSIS — Z1231 Encounter for screening mammogram for malignant neoplasm of breast: Secondary | ICD-10-CM

## 2014-01-01 ENCOUNTER — Ambulatory Visit
Admission: RE | Admit: 2014-01-01 | Discharge: 2014-01-01 | Disposition: A | Discharge status: Home / Self Care | Payer: BC Managed Care – PPO | Source: Ambulatory Visit

## 2014-01-01 DIAGNOSIS — Z1231 Encounter for screening mammogram for malignant neoplasm of breast: Secondary | ICD-10-CM

## 2014-01-03 ENCOUNTER — Other Ambulatory Visit: Payer: Self-pay | Admitting: Obstetrics and Gynecology

## 2014-01-03 DIAGNOSIS — R928 Other abnormal and inconclusive findings on diagnostic imaging of breast: Secondary | ICD-10-CM

## 2014-01-09 ENCOUNTER — Ambulatory Visit
Admission: RE | Admit: 2014-01-09 | Discharge: 2014-01-09 | Disposition: A | Discharge status: Home / Self Care | Payer: BC Managed Care – PPO | Source: Ambulatory Visit | Attending: Obstetrics and Gynecology | Admitting: Obstetrics and Gynecology

## 2014-01-09 DIAGNOSIS — R928 Other abnormal and inconclusive findings on diagnostic imaging of breast: Secondary | ICD-10-CM

## 2014-01-30 ENCOUNTER — Encounter: Payer: Self-pay | Admitting: Family Medicine

## 2014-01-30 ENCOUNTER — Ambulatory Visit (INDEPENDENT_AMBULATORY_CARE_PROVIDER_SITE_OTHER): Payer: BC Managed Care – PPO | Admitting: Family Medicine

## 2014-01-30 VITALS — BP 112/80 | Ht 65.0 in | Wt 143.0 lb

## 2014-01-30 DIAGNOSIS — I951 Orthostatic hypotension: Secondary | ICD-10-CM

## 2014-01-30 DIAGNOSIS — Z23 Encounter for immunization: Secondary | ICD-10-CM

## 2014-01-30 DIAGNOSIS — R5383 Other fatigue: Secondary | ICD-10-CM

## 2014-01-30 NOTE — Progress Notes (Signed)
   Subjective:    Patient ID: Angela Salas, female    DOB: [birth year], 48 y.o.   MRN: [id number]  Dizziness This is a new problem. The current episode started 1 to 4 weeks ago. The problem occurs daily. The problem has been gradually worsening. Associated symptoms comments: Hands tingling . Nothing aggravates the symptoms. She has tried nothing for the symptoms.   Feels like the room is spinning.   Dizzy- feels first in her head.more in the temple. Unsteady,offbalance,moving sensation, occasional episodes for a few seconds  No sinus sx Keeps active eats healthy  Denies chest pain/discomfort. No SOB.  She denies chest pressure or shortness of breath fever chills cough vomiting only occasionally does room feel like it's moving more often she just feels unsteady. She states it's worse when she first sits up or stands up   Review of Systems  Neurological: Positive for dizziness.       Objective:   Physical Exam Patient not toxic lungs are clear no crackles heart is regular pulse normal blood pressure laying sitting standing completed she does have moderate drop in blood pressure as she goes from laying to sitting to standing.  She does not have classic vertigo there is no nystagmus no unilateral weakness or and numbness. Detected either.     Assessment & Plan:  Her dizziness sounds more like orthostasis. I would recommend she reduce HCTZ from 25 mg to half of that. She will use 12.5 mg every morning continue potassium she will check lab work. If she has further progression or problems she will let us known.  She keeps up on preventive health care elsewhere she should followup in one years time certainly followup within the next 2 weeks if not doing well and recommended to do her blood work

## 2014-01-31 LAB — CBC WITH DIFFERENTIAL/PLATELET
BASOS ABS: 0.1 10*3/uL (ref 0.0–0.1)
Basophils Relative: 1 % (ref 0–1)
EOS PCT: 5 % (ref 0–5)
Eosinophils Absolute: 0.4 10*3/uL (ref 0.0–0.7)
HCT: 39.4 % (ref 36.0–46.0)
Hemoglobin: 13.5 g/dL (ref 12.0–15.0)
LYMPHS PCT: 24 % (ref 12–46)
Lymphs Abs: 2 10*3/uL (ref 0.7–4.0)
MCH: 30.7 pg (ref 26.0–34.0)
MCHC: 34.3 g/dL (ref 30.0–36.0)
MCV: 89.5 fL (ref 78.0–100.0)
MONO ABS: 0.8 10*3/uL (ref 0.1–1.0)
Monocytes Relative: 10 % (ref 3–12)
Neutro Abs: 4.9 10*3/uL (ref 1.7–7.7)
Neutrophils Relative %: 60 % (ref 43–77)
Platelets: 230 10*3/uL (ref 150–400)
RBC: 4.4 MIL/uL (ref 3.87–5.11)
RDW: 12.9 % (ref 11.5–15.5)
WBC: 8.2 10*3/uL (ref 4.0–10.5)

## 2014-01-31 LAB — BASIC METABOLIC PANEL
BUN: 16 mg/dL (ref 6–23)
CHLORIDE: 103 meq/L (ref 96–112)
CO2: 28 mEq/L (ref 19–32)
Calcium: 9.3 mg/dL (ref 8.4–10.5)
Creat: 0.82 mg/dL (ref 0.50–1.10)
GLUCOSE: 85 mg/dL (ref 70–99)
Potassium: 3.5 mEq/L (ref 3.5–5.3)
Sodium: 140 mEq/L (ref 135–145)

## 2014-02-10 ENCOUNTER — Encounter: Payer: Self-pay | Admitting: Family Medicine

## 2014-04-09 ENCOUNTER — Telehealth: Payer: Self-pay | Admitting: Family Medicine

## 2014-04-09 MED ORDER — HYDROCHLOROTHIAZIDE 25 MG PO TABS: 25.0000 mg | ORAL_TABLET | Freq: Every day | ORAL | Status: DC

## 2014-04-09 MED ORDER — POTASSIUM CHLORIDE ER 10 MEQ PO TBCR: 10.0000 meq | EXTENDED_RELEASE_TABLET | Freq: Every day | ORAL | Status: DC

## 2014-04-09 NOTE — Telephone Encounter (Signed)
Pt is needing a refill on her blood pressure meds. Pt was last seen  01/30/14. Pt stated that if she needs to be seen that would be fine.  Pt has been out of bp meds for about a week now.

## 2014-04-09 NOTE — Telephone Encounter (Signed)
Medication sent to pharmacy. Patient was notified.  

## 2014-05-23 ENCOUNTER — Telehealth: Payer: Self-pay | Admitting: *Deleted

## 2014-05-23 ENCOUNTER — Ambulatory Visit (INDEPENDENT_AMBULATORY_CARE_PROVIDER_SITE_OTHER): Payer: BLUE CROSS/BLUE SHIELD | Admitting: Family Medicine

## 2014-05-23 ENCOUNTER — Encounter: Payer: Self-pay | Admitting: Family Medicine

## 2014-05-23 VITALS — BP 122/90 | Temp 98.7°F | Ht 65.0 in | Wt 145.0 lb

## 2014-05-23 DIAGNOSIS — R109 Unspecified abdominal pain: Secondary | ICD-10-CM

## 2014-05-23 DIAGNOSIS — Z13220 Encounter for screening for lipoid disorders: Secondary | ICD-10-CM

## 2014-05-23 DIAGNOSIS — I10 Essential (primary) hypertension: Secondary | ICD-10-CM

## 2014-05-23 LAB — POCT URINALYSIS DIPSTICK
SPEC GRAV UA: 1.015
pH, UA: 6

## 2014-05-23 MED ORDER — CHLORZOXAZONE 500 MG PO TABS: 500.0000 mg | ORAL_TABLET | Freq: Three times a day (TID) | ORAL | Status: DC | PRN

## 2014-05-23 NOTE — Telephone Encounter (Signed)
Pt seen today and needs form filled out with bloodwork results. Then fax to number on form. Pt would like a call back after form is faxed. Pt states she will go 2/13 for BW. Form is at the nurses station. After filling out. Need Dr.Steve to sign.

## 2014-05-23 NOTE — Progress Notes (Signed)
   Subjective:    Patient ID: Angela PeerAmanda A Almanzar, female    DOB: 04-27-1965, [age] y.o.   MRN: [id number] Patient arrives with several concerns.  On blood pressure medication. Maintains compliance. Watch her salt intake. No obvious side effects. Time for 6 month visit on her blood pressure. Does not miss a dose.   HPIRight side pain for the past 3 weeks. History of kidney stones.  A little nausea. Pain wakes pt at night. Taking ibuprofen.   Right sided severe excrutiating pain. Occurred with each breath. Very deep. Also occur with certain motions.  No injury  Hx of kidney stones. Patient was wondering if this may be kidney stone pain and whether she may need further workup of potential kidney stones.  No major   Slight nausea  Sl incr frequency,  Phys splinting now pain  Doing more exercise   generall yn not heart burn or reflux  BP overall good when checked elsewhere.   Review of Systems No headache no chest pain no back pain no abdominal pain no change in bowel habits no blood in stool no blood in urine no major increase frequency    Objective:   Physical Exam Alert no acute distress blood pressure good on repeat HEENT normal. Lungs clear heart regular in rhythm. Spine nontender. Negative straight leg raise. Right CVA region nontender right paralumbar region tender to deep palpation.  Urinalysis no hematuria     Assessment & Plan:  Impression musculoskeletal pain primarily by history but also supported by exam. Discussed at length. Not neuropathic. #2 history of kidney stones yet pain and symptoms and presentation not consistent discussed with patient #3 hypertension good control. Will maintain same medications. Plan appropriate yearly blood work. Medications refilled. Diet exercise discussed. Anti-inflammatory medicine for pain. Add chlorzoxazone when necessary. Hold off on imaging rationale discussed. 25 minutes spent most in discussion. WSL

## 2014-05-31 LAB — BASIC METABOLIC PANEL
BUN: 12 mg/dL (ref 6–23)
CALCIUM: 9.1 mg/dL (ref 8.4–10.5)
CO2: 29 meq/L (ref 19–32)
Chloride: 102 mEq/L (ref 96–112)
Creat: 0.7 mg/dL (ref 0.50–1.10)
Glucose, Bld: 80 mg/dL (ref 70–99)
Potassium: 4 mEq/L (ref 3.5–5.3)
Sodium: 138 mEq/L (ref 135–145)

## 2014-05-31 LAB — LIPID PANEL
Cholesterol: 221 mg/dL — ABNORMAL HIGH (ref 0–200)
HDL: 59 mg/dL (ref >39)
LDL Cholesterol: 143 mg/dL — ABNORMAL HIGH (ref 0–99)
Total CHOL/HDL Ratio: 3.7 Ratio
Triglycerides: 93 mg/dL (ref <150)
VLDL: 19 mg/dL (ref 0–40)

## 2014-06-01 ENCOUNTER — Encounter: Payer: Self-pay | Admitting: Family Medicine

## 2014-06-04 NOTE — Telephone Encounter (Signed)
Patient notified, form faxed to company.

## 2014-06-23 ENCOUNTER — Telehealth: Payer: Self-pay | Admitting: Family Medicine

## 2014-06-23 ENCOUNTER — Other Ambulatory Visit: Payer: Self-pay | Admitting: *Deleted

## 2014-06-23 DIAGNOSIS — M549 Dorsalgia, unspecified: Secondary | ICD-10-CM

## 2014-06-23 DIAGNOSIS — M25562 Pain in left knee: Secondary | ICD-10-CM

## 2014-06-23 MED ORDER — CHLORZOXAZONE 500 MG PO TABS: 500.0000 mg | ORAL_TABLET | Freq: Three times a day (TID) | ORAL | Status: DC | PRN

## 2014-06-23 MED ORDER — IBUPROFEN 800 MG PO TABS
800.0000 mg | ORAL_TABLET | Freq: Two times a day (BID) | ORAL | Status: DC | PRN
Start: 2014-06-23 — End: 2014-07-18

## 2014-06-23 NOTE — Telephone Encounter (Signed)
Ok ref chlorzox plus two ref , same numb as last  ibu 800 one bid prn plus two ref, numb 42

## 2014-06-23 NOTE — Telephone Encounter (Signed)
meds sent to pharm. Pt notified.  

## 2014-06-23 NOTE — Telephone Encounter (Signed)
Seen 2/12 for musculoskeletal pain. Pt states pain has eased up but is worse at night. Chlorzoxazone was prescribed. Pt requesting a refill on this and also requesting rx for ibuprofen 800.

## 2014-06-23 NOTE — Telephone Encounter (Signed)
Pt seen on 2/12 for flank pain, states she is still having this pain  At night when she tries to sleep. It has eased up some from original  appt but still not gone. Wants to know if she needs another round of meds that she was issued that day.   [facility]

## 2014-07-11 ENCOUNTER — Telehealth: Payer: Self-pay | Admitting: Family Medicine

## 2014-07-11 DIAGNOSIS — R109 Unspecified abdominal pain: Secondary | ICD-10-CM

## 2014-07-11 NOTE — Telephone Encounter (Signed)
U/S scheduled for next Thursday. Transferred up front to schedule f/u OV with Dr. Lorin PicketScott next Friday. Pt notified and verbalized understanding.

## 2014-07-11 NOTE — Telephone Encounter (Signed)
Seen 2/12 by Dr. Lorin PicketScott

## 2014-07-11 NOTE — Telephone Encounter (Signed)
Pt called stating that she is still having pain in her right side. Pt wants to know If Dr. Lorin PicketScott would recommend her to get an ultrasound. Pt doesn't want to have to keep Taking ibuprofen.

## 2014-07-11 NOTE — Telephone Encounter (Signed)
Pt seen only once for this and six wks ago. Ruq ultrasound plus ov next day with dr Lorin Picketscott to disc results plus ongoing pain

## 2014-07-17 ENCOUNTER — Ambulatory Visit (HOSPITAL_COMMUNITY)
Admission: RE | Admit: 2014-07-17 | Discharge: 2014-07-17 | Disposition: A | Discharge status: Home / Self Care | Payer: BLUE CROSS/BLUE SHIELD | Source: Ambulatory Visit | Attending: Family Medicine | Admitting: Family Medicine

## 2014-07-17 DIAGNOSIS — Z87442 Personal history of urinary calculi: Secondary | ICD-10-CM | POA: Insufficient documentation

## 2014-07-17 DIAGNOSIS — R109 Unspecified abdominal pain: Secondary | ICD-10-CM | POA: Diagnosis not present

## 2014-07-18 ENCOUNTER — Encounter: Payer: Self-pay | Admitting: Family Medicine

## 2014-07-18 ENCOUNTER — Ambulatory Visit (INDEPENDENT_AMBULATORY_CARE_PROVIDER_SITE_OTHER): Payer: BLUE CROSS/BLUE SHIELD | Admitting: Family Medicine

## 2014-07-18 VITALS — BP 114/74 | Temp 98.2°F | Ht 65.0 in | Wt 146.0 lb

## 2014-07-18 DIAGNOSIS — R109 Unspecified abdominal pain: Secondary | ICD-10-CM

## 2014-07-18 DIAGNOSIS — M549 Dorsalgia, unspecified: Secondary | ICD-10-CM

## 2014-07-18 DIAGNOSIS — M25562 Pain in left knee: Secondary | ICD-10-CM

## 2014-07-18 LAB — POCT URINALYSIS DIPSTICK
Protein, UA: 30
Spec Grav, UA: 1.025
pH, UA: 6

## 2014-07-18 MED ORDER — IBUPROFEN 600 MG PO TABS: 600.0000 mg | ORAL_TABLET | Freq: Three times a day (TID) | ORAL | Status: DC | PRN

## 2014-07-18 MED ORDER — LEVOFLOXACIN 500 MG PO TABS: 500.0000 mg | ORAL_TABLET | Freq: Every day | ORAL | Status: AC

## 2014-07-18 NOTE — Progress Notes (Signed)
   Subjective:    Patient ID: Angela PeerAmanda A Salas, female    DOB: [birth year], 49 y.o.   MRN: [id number]  Flank Pain This is a recurrent problem. The current episode started more than 1 month ago (3 montha ago). The problem occurs constantly. The problem has been gradually worsening since onset. Pain location: right flank. The pain does not radiate. The pain is worse during the night. The symptoms are aggravated by bending and twisting. Associated symptoms include abdominal pain. She has tried NSAIDs and muscle relaxant for the symptoms. The treatment provided no relief.    PMH benign see previous note see discussion below   Review of Systems  Gastrointestinal: Positive for abdominal pain.  Genitourinary: Positive for flank pain.       Objective:   Physical Exam   on exam lungs clear hearts regular abdomen soft there is no guarding rebound but there is some tenderness in the right upper quadrant and in the right flank. Urinalysis with some WBCs and epithelial cells      Assessment & Plan:   possible UTI antibiotics prescribed Urine culture taken   this is concerning for the possibility of gallbladder disease I recommend a HIDA test  maintained up needing to have a CAT scan as well  Difficult situation hard to discern if this is thoracic spine impingement causing pain on that side I don't think this is kidney stones. It certainly could be gallbladder we've ordered the appropriate tests  I doubt that it's a intra-abdominal issue such as colon cancer or such like that.

## 2014-07-20 LAB — URINE CULTURE: ORGANISM ID, BACTERIA: NO GROWTH

## 2014-07-22 ENCOUNTER — Ambulatory Visit (HOSPITAL_COMMUNITY)
Admission: RE | Admit: 2014-07-22 | Discharge: 2014-07-22 | Disposition: A | Discharge status: Home / Self Care | Payer: BLUE CROSS/BLUE SHIELD | Source: Ambulatory Visit | Attending: Family Medicine | Admitting: Family Medicine

## 2014-07-22 ENCOUNTER — Other Ambulatory Visit: Payer: Self-pay | Admitting: Family Medicine

## 2014-07-22 ENCOUNTER — Encounter (HOSPITAL_COMMUNITY): Payer: Self-pay

## 2014-07-22 DIAGNOSIS — R109 Unspecified abdominal pain: Secondary | ICD-10-CM

## 2014-07-22 DIAGNOSIS — R1011 Right upper quadrant pain: Secondary | ICD-10-CM | POA: Diagnosis present

## 2014-07-22 DIAGNOSIS — R110 Nausea: Secondary | ICD-10-CM | POA: Diagnosis not present

## 2014-07-22 MED ORDER — STERILE WATER FOR INJECTION IJ SOLN
INTRAMUSCULAR | Status: AC
  Administered 2014-07-22: 1.32 mL
  Filled 2014-07-22: qty 10

## 2014-07-22 MED ORDER — TECHNETIUM TC 99M MEBROFENIN IV KIT
5.0000 | PACK | Freq: Once | INTRAVENOUS | Status: AC | PRN
  Administered 2014-07-22: 5 via INTRAVENOUS

## 2014-07-22 MED ORDER — SINCALIDE 5 MCG IJ SOLR
INTRAMUSCULAR | Status: AC
Start: 2014-07-22 — End: 2014-07-22
  Administered 2014-07-22: 1.32 ug
  Filled 2014-07-22: qty 5

## 2014-11-26 ENCOUNTER — Other Ambulatory Visit: Payer: Self-pay | Admitting: Adult Health

## 2014-12-11 ENCOUNTER — Telehealth: Payer: Self-pay | Admitting: *Deleted

## 2014-12-11 ENCOUNTER — Telehealth: Payer: Self-pay | Admitting: Family Medicine

## 2014-12-11 ENCOUNTER — Other Ambulatory Visit: Payer: Self-pay

## 2014-12-11 DIAGNOSIS — Z1231 Encounter for screening mammogram for malignant neoplasm of breast: Secondary | ICD-10-CM

## 2014-12-11 MED ORDER — HYDROCODONE-ACETAMINOPHEN 5-325 MG PO TABS: ORAL_TABLET | ORAL | Status: DC

## 2014-12-11 MED ORDER — PROMETHAZINE HCL 25 MG PO TABS: ORAL_TABLET | ORAL | Status: DC

## 2014-12-11 NOTE — Telephone Encounter (Signed)
Hydrocod/act 5/325 numb twelve one q 4 to 6 prn  Phenergan 25 21 one tid prn nausea

## 2014-12-11 NOTE — Telephone Encounter (Signed)
Patient has had a headache for the last couple of days.  She is hoping we can just call something in for her to try and get rid of it.   [facility]

## 2014-12-11 NOTE — Telephone Encounter (Signed)
Spoke with patient and patient has c/o nausea, sensitivity to light, and a headache with onset of two days ago. Patient states that she has had similar headaches before. She would like either a pain medication or something for nausea.   Last seen on:07/18/2014 for flank pain.

## 2014-12-11 NOTE — Telephone Encounter (Signed)
Spoke with pt. Pt has a migraine. She hasn't been seen here for 2 years. I advised she would need to call her PCP for med. JSY

## 2014-12-11 NOTE — Telephone Encounter (Signed)
Spoke with patient and informed her that per Dr.Steve Luking the following medications were sent in to the pharmacy:Hydrocod/act 5/325 numb twelve one q 4 to 6 prn, Phenergan 25 21 one tid prn nausea.Informed patient that prescription for hydrocodone must be picked up. Patient verbalized understanding

## 2015-01-01 ENCOUNTER — Telehealth: Payer: Self-pay | Admitting: Family Medicine

## 2015-01-01 NOTE — Telephone Encounter (Signed)
Patient in bed with migraine, has tried laying in a dark room with ice and using  Ibuprofen (has taken 2 but were 6 hours apart) Hurting bad, nausea as well  What else can she do?   Please advise and call pt when done   [facility]

## 2015-01-01 NOTE — Telephone Encounter (Signed)
Reviewed patient call with Dr.Scott in real time. Dr. Lorin Picket reviewed patient's medication list and advised patient to take Hydrocodone and Phenergan for migraine. Called patient and informed her per Dr.Scott that it is ok to take Phenergan and Hydrocodone. Patient verbalized understanding.

## 2015-01-16 ENCOUNTER — Ambulatory Visit: Payer: BLUE CROSS/BLUE SHIELD

## 2015-02-10 ENCOUNTER — Ambulatory Visit
Admission: RE | Admit: 2015-02-10 | Discharge: 2015-02-10 | Disposition: A | Discharge status: Home / Self Care | Payer: BLUE CROSS/BLUE SHIELD | Source: Ambulatory Visit

## 2015-02-10 DIAGNOSIS — Z1231 Encounter for screening mammogram for malignant neoplasm of breast: Secondary | ICD-10-CM

## 2015-02-13 ENCOUNTER — Encounter: Payer: Self-pay | Admitting: Nurse Practitioner

## 2015-02-13 ENCOUNTER — Other Ambulatory Visit: Payer: Self-pay | Admitting: Obstetrics and Gynecology

## 2015-02-13 ENCOUNTER — Ambulatory Visit (INDEPENDENT_AMBULATORY_CARE_PROVIDER_SITE_OTHER): Payer: BLUE CROSS/BLUE SHIELD | Admitting: Nurse Practitioner

## 2015-02-13 VITALS — BP 102/72 | Ht 65.0 in | Wt 146.1 lb

## 2015-02-13 DIAGNOSIS — I10 Essential (primary) hypertension: Secondary | ICD-10-CM

## 2015-02-13 DIAGNOSIS — Z23 Encounter for immunization: Secondary | ICD-10-CM

## 2015-02-13 DIAGNOSIS — G43009 Migraine without aura, not intractable, without status migrainosus: Secondary | ICD-10-CM | POA: Diagnosis not present

## 2015-02-13 DIAGNOSIS — R928 Other abnormal and inconclusive findings on diagnostic imaging of breast: Secondary | ICD-10-CM

## 2015-02-13 MED ORDER — HYDROCHLOROTHIAZIDE 25 MG PO TABS: 25.0000 mg | ORAL_TABLET | Freq: Every day | ORAL | Status: DC

## 2015-02-13 MED ORDER — RIZATRIPTAN BENZOATE 10 MG PO TBDP: 10.0000 mg | ORAL_TABLET | ORAL | Status: DC | PRN

## 2015-02-13 MED ORDER — POTASSIUM CHLORIDE ER 10 MEQ PO TBCR: 10.0000 meq | EXTENDED_RELEASE_TABLET | Freq: Every day | ORAL | Status: DC

## 2015-02-13 MED ORDER — ONDANSETRON 8 MG PO TBDP: 8.0000 mg | ORAL_TABLET | Freq: Three times a day (TID) | ORAL | Status: DC | PRN

## 2015-02-16 ENCOUNTER — Encounter: Payer: Self-pay | Admitting: Nurse Practitioner

## 2015-02-16 NOTE — Progress Notes (Signed)
Subjective:  Presents for routine follow-up on her blood pressure. No chest pain/ischemic type pain or shortness of breath. No edema. Remains under intense stress due to family issues. Has had a flareup of her migraine headaches for 5-6 days, none since yesterday. Nausea, no vomiting. Photosensitivity. No aura. No numbness or weakness of the face arms or legs. No difficulty speaking or swallowing. No change in symptomatology.  Objective:   BP 102/72 mmHg  Ht 5\' 5"  (1.651 m)  Wt 146 lb 2 oz (66.282 kg)  BMI 24.32 kg/m2 NAD. Alert, oriented. Mildly anxious affect. Lungs clear. Heart regular rate rhythm. BP on recheck right arm sitting 112/84. Lower extremities no edema.  Assessment:  Problem List Items Addressed This Visit      Cardiovascular and Mediastinum   Hypertension   Relevant Medications   hydrochlorothiazide (HYDRODIURIL) 25 MG tablet   Migraines   Relevant Medications   rizatriptan (MAXALT-MLT) 10 MG disintegrating tablet   hydrochlorothiazide (HYDRODIURIL) 25 MG tablet    Other Visit Diagnoses    Encounter for immunization    -  Primary      Plan:  Meds ordered this encounter  Medications  . rizatriptan (MAXALT-MLT) 10 MG disintegrating tablet    Sig: Take 1 tablet (10 mg total) by mouth as needed for migraine. May repeat in 2 hours if needed; max 2 per 24 hrs    Dispense:  10 tablet    Refill:  0    Order Specific Question:  Supervising Provider    Answer:  Merlyn AlbertLUKING, WILLIAM S [[id number]]  . ondansetron (ZOFRAN-ODT) 8 MG disintegrating tablet    Sig: Take 1 tablet (8 mg total) by mouth every 8 (eight) hours as needed for nausea or vomiting.    Dispense:  30 tablet    Refill:  0    Order Specific Question:  Supervising Provider    Answer:  Merlyn AlbertLUKING, WILLIAM S [[id number]]  . hydrochlorothiazide (HYDRODIURIL) 25 MG tablet    Sig: Take 1 tablet (25 mg total) by mouth daily.    Dispense:  30 tablet    Refill:  5    Order Specific Question:  Supervising Provider    Answer:  Merlyn AlbertLUKING,  WILLIAM S [[id number]]  . potassium chloride (K-DUR) 10 MEQ tablet    Sig: Take 1 tablet (10 mEq total) by mouth daily.    Dispense:  30 tablet    Refill:  5    Order Specific Question:  Supervising Provider    Answer:  Merlyn AlbertLUKING, WILLIAM S [[id number]]   Discussed options regarding migraines. Patient found her pain medication, has most of this prescription left which she will use only with severe headache. Defers daily medication at this point. Trial of Maxalt as directed. Warning signs reviewed. Patient to call back if no improvement or if symptoms worsen/change. Return in about 6 months (around 08/13/2015).

## 2015-02-19 ENCOUNTER — Ambulatory Visit
Admission: RE | Admit: 2015-02-19 | Discharge: 2015-02-19 | Disposition: A | Discharge status: Home / Self Care | Payer: BLUE CROSS/BLUE SHIELD | Source: Ambulatory Visit | Attending: Obstetrics and Gynecology | Admitting: Obstetrics and Gynecology

## 2015-02-19 DIAGNOSIS — R928 Other abnormal and inconclusive findings on diagnostic imaging of breast: Secondary | ICD-10-CM

## 2015-02-24 ENCOUNTER — Other Ambulatory Visit: Payer: Self-pay | Admitting: Adult Health

## 2015-04-14 ENCOUNTER — Telehealth: Payer: Self-pay | Admitting: Family Medicine

## 2015-04-14 MED ORDER — BENZONATATE 100 MG PO CAPS: 100.0000 mg | ORAL_CAPSULE | Freq: Three times a day (TID) | ORAL | Status: DC | PRN

## 2015-04-14 MED ORDER — HYDROCODONE-HOMATROPINE 5-1.5 MG/5ML PO SYRP: 5.0000 mL | ORAL_SOLUTION | Freq: Every evening | ORAL | Status: DC | PRN

## 2015-04-14 NOTE — Telephone Encounter (Signed)
Patient states that she has just a little congestion but her main concern is just the cough. Patient wants both medications sent in because she will use the tessalon perles during the day while she is at work and the hycodan at bedtime only. Patient was told to come by and pick up script for hycodan. Tessalon was sent to pharmacy.

## 2015-04-14 NOTE — Telephone Encounter (Signed)
First discuss her illness then Choices: Tessalon- 100 mg 1 tid prn cough no drowsiness-#21, 1 refill OR Hycodan 1 tsp q 6 hours but can cause drowsiness for home use only -4 oz- discuss with pt- also if sx worsen or fevers then NTBS

## 2015-04-14 NOTE — Telephone Encounter (Signed)
[facility]  Pt wants to know if you can fill her some cough meds  She has had this nagging cough for 5 days now and has Tried OTC not working.   No shortness of breathe, no fever, is chest congested

## 2015-06-02 ENCOUNTER — Other Ambulatory Visit: Payer: Self-pay | Admitting: Adult Health

## 2015-07-02 ENCOUNTER — Other Ambulatory Visit: Payer: BLUE CROSS/BLUE SHIELD | Admitting: Adult Health

## 2015-07-03 ENCOUNTER — Telehealth: Payer: Self-pay

## 2015-07-03 NOTE — Telephone Encounter (Signed)
Pt has new PCP - i will forward

## 2015-07-03 NOTE — Telephone Encounter (Signed)
Med was not on med list. Called pt and pt states she did not request this med.

## 2015-07-03 NOTE — Telephone Encounter (Signed)
[facility]

## 2015-07-03 NOTE — Telephone Encounter (Signed)
Patient is requesting refill of xanax

## 2015-07-03 NOTE — Telephone Encounter (Signed)
Please contact the patient and assist her with getting this refilled-if you need to run it by me please do

## 2015-07-14 ENCOUNTER — Ambulatory Visit (INDEPENDENT_AMBULATORY_CARE_PROVIDER_SITE_OTHER): Payer: BLUE CROSS/BLUE SHIELD | Admitting: Adult Health

## 2015-07-14 ENCOUNTER — Encounter: Payer: Self-pay | Admitting: Adult Health

## 2015-07-14 VITALS — BP 128/82 | HR 74 | Ht 64.25 in | Wt 146.5 lb

## 2015-07-14 DIAGNOSIS — Z1212 Encounter for screening for malignant neoplasm of rectum: Secondary | ICD-10-CM | POA: Diagnosis not present

## 2015-07-14 DIAGNOSIS — Z01419 Encounter for gynecological examination (general) (routine) without abnormal findings: Secondary | ICD-10-CM | POA: Diagnosis not present

## 2015-07-14 DIAGNOSIS — R1032 Left lower quadrant pain: Secondary | ICD-10-CM

## 2015-07-14 DIAGNOSIS — Z79899 Other long term (current) drug therapy: Secondary | ICD-10-CM

## 2015-07-14 DIAGNOSIS — R1031 Right lower quadrant pain: Secondary | ICD-10-CM

## 2015-07-14 HISTORY — DX: Right lower quadrant pain: R10.31

## 2015-07-14 LAB — HEMOCCULT GUIAC POC 1CARD (OFFICE): Fecal Occult Blood, POC: NEGATIVE

## 2015-07-14 MED ORDER — IBUPROFEN 800 MG PO TABS: 800.0000 mg | ORAL_TABLET | Freq: Three times a day (TID) | ORAL | Status: DC | PRN

## 2015-07-14 MED ORDER — ESTRADIOL 0.1 MG/24HR TD PTTW: MEDICATED_PATCH | TRANSDERMAL | Status: DC

## 2015-07-14 NOTE — Progress Notes (Signed)
Patient ID: Myer PeerAmanda A Baldree, female   DOB: 1966-03-10, 50 y.o.   MRN: [id number] History of Present Illness: Angela Salas is a 50 year old white female, married in for a well woman gyn exam, she is sp hysterectomy. She is complaining of bilateral groin pain that radiates at times down her leg, sometimes low back hurts. She is happy with her patches. PCP is DTE Energy CompanyScott Luking.   Current Medications, Allergies, Past Medical History, Past Surgical History, Family History and Social History were reviewed in [facility] electronic medical record.     Review of Systems: Patient denies any headaches, hearing loss, fatigue, blurred vision, shortness of breath, chest pain, abdominal pain, problems with bowel movements, urination, or intercourse. No joint pain or mood swings.See HPI for positives.    Physical Exam:BP 128/82 mmHg  Pulse 74  Ht 5' 4.25" (1.632 m)  Wt 146 lb 8 oz (66.452 kg)  BMI 24.95 kg/m2 General:  Well developed, well nourished, no acute distress Skin:  Warm and dry Neck:  Midline trachea, normal thyroid, good ROM, no lymphadenopathy Lungs; Clear to auscultation bilaterally Breast:  No dominant palpable mass, retraction, or nipple discharge Cardiovascular: Regular rate and rhythm Abdomen:  Soft, non tender, no hepatosplenomegaly Pelvic:  External genitalia is normal in appearance, no lesions.  The vagina is normal in appearance. Urethra has no lesions or masses. The cervix and uterus are absent.  No adnexal masses or tenderness noted.Bladder is non tender, no masses felt.Could not reproduce the pain on exam is probably back related. Rectal: Good sphincter tone, no polyps, or hemorrhoids felt.  Hemoccult negative. Extremities/musculoskeletal:  No swelling or varicosities noted, no clubbing or cyanosis Psych:  No mood changes, alert and cooperative,seems happy Try motrin and call Dr Romeo AppleHarrison in follow up.   Impression: Well woman gyn exam no pap ET Groin pain    Plan: Refilled  estradiol patch 0.1 mg use 1 2 x weekly disp # 8 with 12 refills Physical in 1 year Mammogram yearly Colonoscopy advised  Call Dr Romeo AppleHarrison regarding back Labs with PCP Rx motrin 800 mg #60 take 1 every 8 hours prn pain with 2 refills

## 2015-07-14 NOTE — Patient Instructions (Signed)
Labs with PCP Physical in 1 year Mammogram yearly Colonoscopy advised Call Dr Romeo AppleHarrison for back

## 2015-09-21 ENCOUNTER — Ambulatory Visit (INDEPENDENT_AMBULATORY_CARE_PROVIDER_SITE_OTHER): Payer: BLUE CROSS/BLUE SHIELD | Admitting: Family Medicine

## 2015-09-21 ENCOUNTER — Encounter: Payer: Self-pay | Admitting: Family Medicine

## 2015-09-21 VITALS — BP 112/80 | Temp 97.5°F | Ht 65.0 in | Wt 142.2 lb

## 2015-09-21 DIAGNOSIS — J329 Chronic sinusitis, unspecified: Secondary | ICD-10-CM

## 2015-09-21 DIAGNOSIS — J310 Chronic rhinitis: Secondary | ICD-10-CM

## 2015-09-21 MED ORDER — AMOXICILLIN 500 MG PO CAPS: 500.0000 mg | ORAL_CAPSULE | Freq: Three times a day (TID) | ORAL | Status: DC

## 2015-09-21 NOTE — Progress Notes (Signed)
   Subjective:    Patient ID: Angela Salas, female    DOB: April 16, 1965, 50 y.o.   MRN: [id number]  Sore Throat  This is a new problem. The current episode started yesterday. The problem has been unchanged. Neither side of throat is experiencing more pain than the other. There has been no fever. The pain is moderate. Associated symptoms include congestion and ear pain. Associated symptoms comments: Body aches. She has tried NSAIDs for the symptoms. The treatment provided no relief.   Patient has no other concerns at this time.   Throat hurting for a few days  Bad ear ache both ears  Took ibupro  Cong snd dranage and stuffiness   incr allergies    Temples and headache and cong    Review of Systems  HENT: Positive for congestion and ear pain.        Objective:   Physical Exam  Alert vitals stable. Alert, mild malaise. Hydration good Vitals stable. frontal/ maxillary tenderness evident positive nasal congestion. pharynx normal neck supple  lungs clear/no crackles or wheezes. heart regular in rhythm Tympanic membranes bilateral retraction pharynx erythematous      Assessment & Plan:  Impression rhinosinusitis likely post viral, discussed with patient. plan antibiotics prescribed. Questions answered. Symptomatic care discussed. warning signs discussed. WSL

## 2015-10-19 ENCOUNTER — Telehealth: Payer: Self-pay | Admitting: Family Medicine

## 2015-10-19 ENCOUNTER — Other Ambulatory Visit: Payer: Self-pay | Admitting: *Deleted

## 2015-10-19 MED ORDER — SCOPOLAMINE 1 MG/3DAYS TD PT72: 1.0000 | MEDICATED_PATCH | TRANSDERMAL | Status: DC

## 2015-10-19 NOTE — Telephone Encounter (Signed)
rx sent to pharm per dr scott. Pt notified.  

## 2015-10-19 NOTE — Telephone Encounter (Signed)
Patient is going on a cruise and is requesting transderm patches called in. ° ° °[facility] °

## 2016-01-14 ENCOUNTER — Other Ambulatory Visit: Payer: Self-pay | Admitting: Obstetrics and Gynecology

## 2016-01-14 DIAGNOSIS — Z1231 Encounter for screening mammogram for malignant neoplasm of breast: Secondary | ICD-10-CM

## 2016-02-09 ENCOUNTER — Other Ambulatory Visit: Payer: Self-pay | Admitting: Nurse Practitioner

## 2016-02-12 ENCOUNTER — Ambulatory Visit
Admission: RE | Admit: 2016-02-12 | Discharge: 2016-02-12 | Disposition: A | Discharge status: Home / Self Care | Payer: BLUE CROSS/BLUE SHIELD | Source: Ambulatory Visit | Attending: Obstetrics and Gynecology | Admitting: Obstetrics and Gynecology

## 2016-02-12 DIAGNOSIS — Z1231 Encounter for screening mammogram for malignant neoplasm of breast: Secondary | ICD-10-CM

## 2016-03-17 ENCOUNTER — Other Ambulatory Visit: Payer: Self-pay | Admitting: Family Medicine

## 2016-04-06 ENCOUNTER — Other Ambulatory Visit: Payer: Self-pay | Admitting: Family Medicine

## 2016-04-06 NOTE — Telephone Encounter (Signed)
No check up here in the past year.  Also, last refills sent noting "must sch appt for more refills".  Pt does not have appt sch.

## 2016-04-06 NOTE — Telephone Encounter (Signed)
Nurse call pt, refill only after spking and setting up appt, then give thirty d worth

## 2016-04-06 NOTE — Telephone Encounter (Signed)
Left message to return call 

## 2016-04-18 ENCOUNTER — Encounter: Payer: Self-pay | Admitting: Family Medicine

## 2016-04-18 ENCOUNTER — Ambulatory Visit (INDEPENDENT_AMBULATORY_CARE_PROVIDER_SITE_OTHER): Payer: BLUE CROSS/BLUE SHIELD | Admitting: Family Medicine

## 2016-04-18 VITALS — BP 110/80 | Ht 65.0 in | Wt 145.0 lb

## 2016-04-18 DIAGNOSIS — Z1211 Encounter for screening for malignant neoplasm of colon: Secondary | ICD-10-CM

## 2016-04-18 DIAGNOSIS — Z13220 Encounter for screening for lipoid disorders: Secondary | ICD-10-CM

## 2016-04-18 DIAGNOSIS — I10 Essential (primary) hypertension: Secondary | ICD-10-CM | POA: Diagnosis not present

## 2016-04-18 DIAGNOSIS — Z79899 Other long term (current) drug therapy: Secondary | ICD-10-CM

## 2016-04-18 MED ORDER — POTASSIUM CHLORIDE ER 10 MEQ PO TBCR: 10.0000 meq | EXTENDED_RELEASE_TABLET | Freq: Every day | ORAL | 12 refills | Status: DC

## 2016-04-18 MED ORDER — HYDROCHLOROTHIAZIDE 25 MG PO TABS: 25.0000 mg | ORAL_TABLET | Freq: Every day | ORAL | 12 refills | Status: DC

## 2016-04-18 NOTE — Progress Notes (Signed)
   Subjective:    Patient ID: Angela Salas, female    DOB: 1965-06-27, 51 y.o.   MRN: [id number]  Hypertension  This is a chronic problem. The current episode started more than 1 year ago. The problem has been gradually improving since onset. There are no associated agents to hypertension. There are no known risk factors for coronary artery disease. Treatments tried: hydrochlorothiazide. The current treatment provides moderate improvement. There are no compliance problems.    Patient has no concerns at this time.    Review of Systems Patient denies any chest tightness pressure pain shortness breath nausea vomiting diarrhea denies rectal bleeding    Objective:   Physical Exam Lungs clear heart regular extremities no edema skin warm dry blood pressure good       Assessment & Plan:  Patient was advised to get colonoscopy referral put in  Hypertension good control continue current measures  Screening lab work including met 7 lipid profile ordered  Keep up the female health checkups

## 2016-05-01 ENCOUNTER — Encounter: Payer: Self-pay | Admitting: Family Medicine

## 2016-05-01 LAB — BASIC METABOLIC PANEL
BUN/Creatinine Ratio: 20 (ref 9–23)
BUN: 16 mg/dL (ref 6–24)
CO2: 24 mmol/L (ref 18–29)
CREATININE: 0.8 mg/dL (ref 0.57–1.00)
Calcium: 9.1 mg/dL (ref 8.7–10.2)
Chloride: 102 mmol/L (ref 96–106)
GFR calc Af Amer: 99 mL/min/{1.73_m2} (ref >59)
GFR, EST NON AFRICAN AMERICAN: 86 mL/min/{1.73_m2} (ref >59)
Glucose: 88 mg/dL (ref 65–99)
Potassium: 4.2 mmol/L (ref 3.5–5.2)
Sodium: 142 mmol/L (ref 134–144)

## 2016-05-01 LAB — LIPID PANEL
CHOLESTEROL TOTAL: 217 mg/dL — AB (ref 100–199)
Chol/HDL Ratio: 3.9 ratio units (ref 0.0–4.4)
HDL: 55 mg/dL (ref >39)
LDL Calculated: 139 mg/dL — ABNORMAL HIGH (ref 0–99)
Triglycerides: 116 mg/dL (ref 0–149)
VLDL Cholesterol Cal: 23 mg/dL (ref 5–40)

## 2016-05-02 ENCOUNTER — Encounter: Payer: Self-pay | Admitting: *Deleted

## 2016-05-03 ENCOUNTER — Telehealth: Payer: Self-pay | Admitting: Family Medicine

## 2016-05-03 NOTE — Telephone Encounter (Signed)
Patient faxed in a biometric screening form that she said Dr. Lorin PicketScott told her to fax over and we would be able to fill out.  This is in Dr. Roby LoftsScott's form box in his office.

## 2016-05-04 ENCOUNTER — Telehealth: Payer: Self-pay

## 2016-05-05 NOTE — Telephone Encounter (Signed)
Form was done-please put in my UPIN # also put in office address etc. Then fax and mail copy to pt and scan into system-indicate to pt it was faxed please-thanks!!

## 2016-05-10 ENCOUNTER — Telehealth: Payer: Self-pay | Admitting: Family Medicine

## 2016-05-10 ENCOUNTER — Other Ambulatory Visit: Payer: Self-pay | Admitting: Nurse Practitioner

## 2016-05-10 MED ORDER — HYDROCODONE-ACETAMINOPHEN 5-325 MG PO TABS: 1.0000 | ORAL_TABLET | ORAL | 0 refills | Status: DC | PRN

## 2016-05-10 MED ORDER — PROMETHAZINE HCL 25 MG PO TABS: 25.0000 mg | ORAL_TABLET | Freq: Three times a day (TID) | ORAL | 0 refills | Status: DC | PRN

## 2016-05-10 NOTE — Telephone Encounter (Signed)
Nausea med sent in; she will need to pick up pain med Rx

## 2016-05-10 NOTE — Telephone Encounter (Signed)
Patient notified. Script ready for pickup upfront.

## 2016-05-10 NOTE — Telephone Encounter (Signed)
Patient has had migraine since 1/29 and would like something called into [facility] [place]

## 2016-05-10 NOTE — Telephone Encounter (Signed)
Patient is having nausea and a bad headache that has been present since Sunday. Patient is taking ibuprofen 800 mg but it is not helping. She would like something else prescribed and also something to help with the nausea.

## 2016-05-16 ENCOUNTER — Other Ambulatory Visit: Payer: Self-pay

## 2016-05-16 ENCOUNTER — Telehealth: Payer: Self-pay | Admitting: Gastroenterology

## 2016-05-16 DIAGNOSIS — Z1211 Encounter for screening for malignant neoplasm of colon: Secondary | ICD-10-CM

## 2016-05-16 NOTE — Telephone Encounter (Signed)
Pt called office back and [facility] Correct fax number [phone].

## 2016-05-16 NOTE — Telephone Encounter (Signed)
Gastroenterology Pre-Procedure Review  Request Date: 05/04/2016 Requesting Physician: Dr. Lilyan PuntScott Luking  PATIENT REVIEW QUESTIONS: The patient responded to the following health history questions as indicated:    1. Diabetes Melitis: no 2. Joint replacements in the past 12 months: no 3. Major health problems in the past 3 months: no 4. Has an artificial valve or MVP: no 5. Has a defibrillator: no 6. Has been advised in past to take antibiotics in advance of a procedure like teeth cleaning: no 7. Family history of colon cancer: no  8. Alcohol Use: no 9. History of sleep apnea: no  10. History of coronary artery or other vascular stents placed within the last 12 months: no    PT was given the Hydrocodone for migraines but has not needed it since she got it. Not needed the phenergan either.   MEDICATIONS & ALLERGIES:    Patient reports the following regarding taking any blood thinners:   Plavix? no Aspirin? no Coumadin? no Brilinta? no Xarelto? no Eliquis? no Pradaxa? no Savaysa? no Effient? no  Patient confirms/reports the following medications:  Current Outpatient Prescriptions  Medication Sig Dispense Refill  . estradiol (VIVELLE-DOT) 0.1 MG/24HR patch apply 1 patch two times a week as directed 8 patch 12  . hydrochlorothiazide (HYDRODIURIL) 25 MG tablet Take 1 tablet (25 mg total) by mouth daily. 30 tablet 12  . potassium chloride (K-DUR) 10 MEQ tablet Take 1 tablet (10 mEq total) by mouth daily. 30 tablet 12  . HYDROcodone-acetaminophen (NORCO/VICODIN) 5-325 MG tablet Take 1 tablet by mouth every 4 (four) hours as needed. (Patient not taking: Reported on 05/16/2016) 12 tablet 0  . promethazine (PHENERGAN) 25 MG tablet Take 1 tablet (25 mg total) by mouth every 8 (eight) hours as needed for nausea or vomiting. (Patient not taking: Reported on 05/16/2016) 20 tablet 0   No current facility-administered medications for this visit.     Patient confirms/reports the following  allergies:  No Known Allergies  No orders of the defined types were placed in this encounter.   AUTHORIZATION INFORMATION Primary Insurance:  ID #:  Group #:  Pre-Cert / Auth required: Pre-Cert / Auth #:   Secondary Insurance:  ID #:   Group #:  Pre-Cert / Auth required:  Pre-Cert / Auth #:   SCHEDULE INFORMATION: Procedure has been scheduled as follows:  Date:  05/20/2016           Time:  11:30 AM Location: [facility] Short Stay This Gastroenterology Pre-Precedure Review Form is being routed to the following provider(s): Jonette EvaSandi Fields, MD

## 2016-05-16 NOTE — Telephone Encounter (Signed)
Pt called to say that she is scheduled for a colonoscopy with [facility] on Friday and hasn't received her instructions. She asked if we would fax them to her. Fax # [phone] and she also said that the prep wasn't at her pharmacy.

## 2016-05-17 MED ORDER — PEG-KCL-NACL-NASULF-NA ASC-C 100 G PO SOLR: 1.0000 | ORAL | 0 refills | Status: DC

## 2016-05-17 NOTE — Telephone Encounter (Signed)
Noted and faxed instructions to this number.

## 2016-05-17 NOTE — Telephone Encounter (Signed)
MOVI PREP SPLIT DOSING, REGULAR BREAKFAST. CLEAR LIQUIDS AFTER 9 AM.  HOLD HYDROCHLORTHIAZIDE on morning of TCS..Marland Kitchen

## 2016-05-17 NOTE — Telephone Encounter (Signed)
Rx sent to the pharmacy and instructions faxed to pt at [phone]. Hand wrote the HOLD HCTZ ON THE AM OF THE PROCEDURE on the instruction sheet.  Pt is aware it is being faxed now.

## 2016-05-18 ENCOUNTER — Telehealth: Payer: Self-pay

## 2016-05-18 ENCOUNTER — Encounter: Payer: Self-pay | Admitting: Family Medicine

## 2016-05-18 NOTE — Telephone Encounter (Signed)
Per Elby ShowersAnn W @ [employer] [place] not required for the screening colonoscopy. Reference # [id number].

## 2016-05-20 ENCOUNTER — Encounter (HOSPITAL_COMMUNITY)
Admission: RE | Disposition: A | Discharge status: Home / Self Care | Payer: Self-pay | Source: Ambulatory Visit | Attending: Gastroenterology

## 2016-05-20 ENCOUNTER — Encounter (HOSPITAL_COMMUNITY): Payer: Self-pay | Admitting: *Deleted

## 2016-05-20 ENCOUNTER — Ambulatory Visit (HOSPITAL_COMMUNITY)
Admission: RE | Admit: 2016-05-20 | Discharge: 2016-05-20 | Disposition: A | Discharge status: Home / Self Care | Payer: BLUE CROSS/BLUE SHIELD | Source: Ambulatory Visit | Attending: Gastroenterology | Admitting: Gastroenterology

## 2016-05-20 DIAGNOSIS — Z1211 Encounter for screening for malignant neoplasm of colon: Secondary | ICD-10-CM | POA: Diagnosis not present

## 2016-05-20 DIAGNOSIS — I10 Essential (primary) hypertension: Secondary | ICD-10-CM | POA: Diagnosis not present

## 2016-05-20 DIAGNOSIS — Z79899 Other long term (current) drug therapy: Secondary | ICD-10-CM | POA: Insufficient documentation

## 2016-05-20 DIAGNOSIS — Z79890 Hormone replacement therapy (postmenopausal): Secondary | ICD-10-CM | POA: Insufficient documentation

## 2016-05-20 DIAGNOSIS — K5730 Diverticulosis of large intestine without perforation or abscess without bleeding: Secondary | ICD-10-CM | POA: Diagnosis not present

## 2016-05-20 DIAGNOSIS — K644 Residual hemorrhoidal skin tags: Secondary | ICD-10-CM | POA: Insufficient documentation

## 2016-05-20 DIAGNOSIS — Z1212 Encounter for screening for malignant neoplasm of rectum: Secondary | ICD-10-CM

## 2016-05-20 DIAGNOSIS — K635 Polyp of colon: Secondary | ICD-10-CM

## 2016-05-20 HISTORY — PX: COLONOSCOPY: SHX5424

## 2016-05-20 HISTORY — DX: Other specified postprocedural states: Z98.890

## 2016-05-20 HISTORY — DX: Adverse effect of unspecified anesthetic, initial encounter: T41.45XA

## 2016-05-20 HISTORY — DX: Nausea with vomiting, unspecified: R11.2

## 2016-05-20 HISTORY — DX: Other complications of anesthesia, initial encounter: T88.59XA

## 2016-05-20 SURGERY — COLONOSCOPY
Anesthesia: Moderate Sedation

## 2016-05-20 MED ORDER — SODIUM CHLORIDE 0.9% FLUSH
INTRAVENOUS | Status: AC
  Filled 2016-05-20: qty 10

## 2016-05-20 MED ORDER — MEPERIDINE HCL 100 MG/ML IJ SOLN
INTRAMUSCULAR | Status: DC
Start: 2016-05-20 — End: 2016-05-20
  Filled 2016-05-20: qty 2

## 2016-05-20 MED ORDER — MIDAZOLAM HCL 5 MG/5ML IJ SOLN
INTRAMUSCULAR | Status: DC | PRN
  Administered 2016-05-20 (×2): 2 mg via INTRAVENOUS

## 2016-05-20 MED ORDER — SIMETHICONE 40 MG/0.6ML PO SUSP
ORAL | Status: DC | PRN
  Administered 2016-05-20: 11:00:00

## 2016-05-20 MED ORDER — MEPERIDINE HCL 100 MG/ML IJ SOLN
INTRAMUSCULAR | Status: DC | PRN
  Administered 2016-05-20 (×2): 25 mg

## 2016-05-20 MED ORDER — SODIUM CHLORIDE 0.9 % IV SOLN
INTRAVENOUS | Status: DC
  Administered 2016-05-20: 11:00:00 via INTRAVENOUS

## 2016-05-20 MED ORDER — PROMETHAZINE HCL 25 MG/ML IJ SOLN
12.5000 mg | Freq: Once | INTRAMUSCULAR | Status: AC
  Administered 2016-05-20: 12.5 mg via INTRAVENOUS

## 2016-05-20 MED ORDER — PROMETHAZINE HCL 25 MG/ML IJ SOLN
INTRAMUSCULAR | Status: AC
  Filled 2016-05-20: qty 1

## 2016-05-20 MED ORDER — MIDAZOLAM HCL 5 MG/5ML IJ SOLN
INTRAMUSCULAR | Status: AC
  Filled 2016-05-20: qty 10

## 2016-05-20 NOTE — Op Note (Signed)
[facility] Patient Name: Angela Salas Procedure Date: 05/20/2016 11:24 AM MRN: [id number] Date of Birth: [birth year] Attending MD: Jonette Eva , MD CSN: [id number] Age: 51 Admit Type: Outpatient Procedure:                Colonoscopy WITH COLD SNARE POLYPECTOMY Indications:              Screening for colorectal malignant neoplasm Providers:                Jonette Eva, MD, Jannett Celestine, RN, Lollie Marrow. Lake,                            Pensions consultant Referring MD:             Jonna Coup. Luking Medicines:                Promethazine 12.5 mg IV, Meperidine 50 mg IV,                            Midazolam 4 mg IV Complications:            No immediate complications. Estimated Blood Loss:     Estimated blood loss was minimal. Procedure:                Pre-Anesthesia Assessment:                           - Prior to the procedure, a History and Physical                            was performed, and patient medications and                            allergies were reviewed. The patient's tolerance of                            previous anesthesia was also reviewed. The risks                            and benefits of the procedure and the sedation                            options and risks were discussed with the patient.                            All questions were answered, and informed consent                            was obtained. Prior Anticoagulants: The patient has                            taken ibuprofen. ASA Grade Assessment: II - A                            patient with mild systemic disease. After reviewing  the risks and benefits, the patient was deemed in                            satisfactory condition to undergo the procedure.                            After obtaining informed consent, the colonoscope                            was passed under direct vision. Throughout the                            procedure, the patient's blood pressure, pulse,  and                            oxygen saturations were monitored continuously. The                            EC-[id number] ([id number] scope was introduced through                            the anus and advanced to the the cecum, identified                            by appendiceal orifice and ileocecal valve. The                            ileocecal valve, appendiceal orifice, and rectum                            were photographed. The colonoscopy was somewhat                            difficult due to a tortuous colon. Successful                            completion of the procedure was aided by COLOWRAP.                            The patient tolerated the procedure well. The                            quality of the bowel preparation was excellent. Scope In: 11:33:56 AM Scope Out: 11:48:00 AM Scope Withdrawal Time: 0 hours 11 minutes 2 seconds  Total Procedure Duration: 0 hours 14 minutes 4 seconds  Findings:      The recto-sigmoid colon and sigmoid colon were moderately redundant.      A 5 mm polyp was found in the distal transverse colon. The polyp was       sessile. The polyp was removed with a cold snare. Resection and       retrieval were complete.      Multiple small and large-mouthed diverticula were found in the sigmoid       colon, descending colon and hepatic flexure.  External hemorrhoids were found during retroflexion. The hemorrhoids       were moderate. Impression:               - Redundant LEFT colon.                           - One 5 mm polyp in the distal transverse colon,                            removed with a cold snare. Resected and retrieved.                           - Diverticulosis in the sigmoid colon, in the                            descending colon and at the hepatic flexure.                           - External hemorrhoids. Moderate Sedation:      Moderate (conscious) sedation was administered by the endoscopy nurse       and supervised by the  endoscopist. The following parameters were       monitored: oxygen saturation, heart rate, blood pressure, and response       to care. Total physician intraservice time was 35 minutes. Recommendation:           - High fiber diet.                           - Continue present medications.                           - Await pathology results.                           - Repeat colonoscopy in 5-10 years for surveillance.                           - Patient has a contact number available for                            emergencies. The signs and symptoms of potential                            delayed complications were discussed with the                            patient. Return to normal activities tomorrow.                            Written discharge instructions were provided to the                            patient. Procedure Code(s):        --- Professional ---  [phone], Colonoscopy, flexible; with removal of                            tumor(s), polyp(s), or other lesion(s) by snare                            technique                           99152, Moderate sedation services provided by the                            same physician or other qualified health care                            professional performing the diagnostic or                            therapeutic service that the sedation supports,                            requiring the presence of an independent trained                            observer to assist in the monitoring of the                            patient's level of consciousness and physiological                            status; initial 15 minutes of intraservice time,                            patient age 52 years or older                           [phone], Moderate sedation services; each additional                            15 minutes intraservice time Diagnosis Code(s):        --- Professional ---                            Z12.11, Encounter for screening for malignant                            neoplasm of colon                           D12.3, Benign neoplasm of transverse colon (hepatic                            flexure or splenic flexure)                           K64.4, Residual hemorrhoidal  skin tags                           K57.30, Diverticulosis of large intestine without                            perforation or abscess without bleeding                           Q43.8, Other specified congenital malformations of                            intestine CPT copyright 2016 American Medical Association. All rights reserved. The codes documented in this report are preliminary and upon coder review may  be revised to meet current compliance requirements. Jonette Eva, MD Jonette Eva, MD 05/20/2016 12:02:46 PM This report has been signed electronically. Number of Addenda: 0

## 2016-05-20 NOTE — H&P (Signed)
Primary Care Physician:  Sallee Lange, MD Primary Gastroenterologist:  Dr. Oneida Alar  Pre-Procedure History & Physical: HPI:  Angela Salas is a 51 y.o. female here for COLON CANCER SCREENING.  Past Medical History:  Diagnosis Date  . Bilateral groin pain 07/14/2015  . Complication of anesthesia   . Current use of estrogen therapy 10/17/2012  . Hypertension   . Kidney stone 09/2008  . Migraines   . PONV (postoperative nausea and vomiting)     Past Surgical History:  Procedure Laterality Date  . ABDOMINAL HYSTERECTOMY    . lap lso      Prior to Admission medications   Medication Sig Start Date End Date Taking? Authorizing Provider  estradiol (VIVELLE-DOT) 0.1 MG/24HR patch apply 1 patch two times a week as directed 07/14/15  Yes Estill Dooms, NP  hydrochlorothiazide (HYDRODIURIL) 25 MG tablet Take 1 tablet (25 mg total) by mouth daily. 04/18/16  Yes Kathyrn Drown, MD  HYDROcodone-acetaminophen (NORCO/VICODIN) 5-325 MG tablet Take 1 tablet by mouth every 4 (four) hours as needed. Patient taking differently: Take 1 tablet by mouth every 4 (four) hours as needed for moderate pain.  05/10/16  Yes Nilda Simmer, NP  ibuprofen (ADVIL,MOTRIN) 800 MG tablet Take 800 mg by mouth daily as needed for pain. 05/09/16  Yes Historical Provider, MD  peg 3350 powder (MOVIPREP) 100 g SOLR Take 1 kit (200 g total) by mouth as directed. 05/17/16  Yes Danie Binder, MD  potassium chloride (K-DUR) 10 MEQ tablet Take 1 tablet (10 mEq total) by mouth daily. 04/18/16  Yes Kathyrn Drown, MD  promethazine (PHENERGAN) 25 MG tablet Take 1 tablet (25 mg total) by mouth every 8 (eight) hours as needed for nausea or vomiting. 05/10/16  Yes Nilda Simmer, NP    Allergies as of 05/16/2016  . (No Known Allergies)    Family History  Problem Relation Age of Onset  . Diabetes Mother   . Hypertension Mother   . Depression Mother   . Other Mother     committed suicide  . Diabetes Maternal Grandmother      Social History   Social History  . Marital status: Married    Spouse name: N/A  . Number of children: N/A  . Years of education: N/A   Occupational History  . Not on file.   Social History Main Topics  . Smoking status: Never Smoker  . Smokeless tobacco: Never Used  . Alcohol use No  . Drug use: No  . Sexual activity: Yes    Birth control/ protection: Surgical     Comment: hyst   Other Topics Concern  . Not on file   Social History Narrative  . No narrative on file    Review of Systems: See HPI, otherwise negative ROS   Physical Exam: BP 122/85   Pulse 76   Resp 19   Ht _0  (1.651 m)   Wt 145 lb (65.8 kg)   SpO2 99%   BMI 24.13 kg/m  General:   Alert,  pleasant and cooperative in NAD Head:  Normocephalic and atraumatic. Neck:  Supple; Lungs:  Clear throughout to auscultation.    Heart:  Regular rate and rhythm. Abdomen:  Soft, nontender and nondistended. Normal bowel sounds, without guarding, and without rebound.   Neurologic:  Alert and  oriented x4;  grossly normal neurologically.  Impression/Plan:     SCREENING  Plan:  1. TCS TODAY. DISCUSSED PROCEDURE, BENEFITS, & RISKS: < 1% chance  of medication reaction, bleeding, perforation, or rupture of spleen/liver.

## 2016-05-20 NOTE — Discharge Instructions (Signed)
You have MODERATE EXTERNAL hemorrhoids. YOU HAVE diverticulosis IN YOUR LEFT AND RIGHT COLON. YOU HAD ONE SMALL POLYP REMOVED.    DRINK WATER TO KEEP YOUR URINE LIGHT YELLOW.  FOLLOW A HIGH FIBER DIET. AVOID ITEMS THAT CAUSE BLOATING. See info below.  USE PREPARATION H FOUR TIMES  A DAY IF NEEDED TO RELIEVE RECTAL PAIN/PRESSURE/BLEEDING.   YOUR BIOPSY RESULTS WILL BE AVAILABLE IN MY CHART  FEB 13 AND MY OFFICE WILL CONTACT YOU IN 10-14 DAYS WITH YOUR RESULTS.   Next colonoscopy in 5-10 years.  Colonoscopy Care After Read the instructions outlined below and refer to this sheet in the next week. These discharge instructions provide you with general information on caring for yourself after you leave the hospital. While your treatment has been planned according to the most current medical practices available, unavoidable complications occasionally occur. If you have any problems or questions after discharge, call DR. Zane Samson, [phone].  ACTIVITY  You may resume your regular activity, but move at a slower pace for the next 24 hours.   Take frequent rest periods for the next 24 hours.   Walking will help get rid of the air and reduce the bloated feeling in your belly (abdomen).   No driving for 24 hours (because of the medicine (anesthesia) used during the test).   You may shower.   Do not sign any important legal documents or operate any machinery for 24 hours (because of the anesthesia used during the test).    NUTRITION  Drink plenty of fluids.   You may resume your normal diet as instructed by your doctor.   Begin with a light meal and progress to your normal diet. Heavy or fried foods are harder to digest and may make you feel sick to your stomach (nauseated).   Avoid alcoholic beverages for 24 hours or as instructed.    MEDICATIONS  You may resume your normal medications.   WHAT YOU CAN EXPECT TODAY  Some feelings of bloating in the abdomen.   Passage of more  gas than usual.   Spotting of blood in your stool or on the toilet paper  .  IF YOU HAD POLYPS REMOVED DURING THE COLONOSCOPY:  Eat a soft diet IF YOU HAVE NAUSEA, BLOATING, ABDOMINAL PAIN, OR VOMITING.    FINDING OUT THE RESULTS OF YOUR TEST Not all test results are available during your visit. DR. Darrick PennaFIELDS WILL CALL YOU WITHIN 14 DAYS OF YOUR PROCEDUE WITH YOUR RESULTS. Do not assume everything is normal if you have not heard from DR. Merric Yost, CALL HER OFFICE AT [phone].  SEEK IMMEDIATE MEDICAL ATTENTION AND CALL THE OFFICE: [phone] IF:  You have more than a spotting of blood in your stool.   Your belly is swollen (abdominal distention).   You are nauseated or vomiting.   You have a temperature over 101F.   You have abdominal pain or discomfort that is severe or gets worse throughout the day.  High-Fiber Diet A high-fiber diet changes your normal diet to include more whole grains, legumes, fruits, and vegetables. Changes in the diet involve replacing refined carbohydrates with unrefined foods. The calorie level of the diet is essentially unchanged. The Dietary Reference Intake (recommended amount) for adult males is 38 grams per day. For adult females, it is 25 grams per day. Pregnant and lactating women should consume 28 grams of fiber per day. Fiber is the intact part of a plant that is not broken down during digestion. Functional fiber is fiber that  has been isolated from the plant to provide a beneficial effect in the body. PURPOSE  Increase stool bulk.   Ease and regulate bowel movements.   Lower cholesterol.  REDUCE RISK OF COLON CANCER  INDICATIONS THAT YOU NEED MORE FIBER  Constipation and hemorrhoids.   Uncomplicated diverticulosis (intestine condition) and irritable bowel syndrome.   Weight management.   As a protective measure against hardening of the arteries (atherosclerosis), diabetes, and cancer.   GUIDELINES FOR INCREASING FIBER IN THE  DIET  Start adding fiber to the diet slowly. A gradual increase of about 5 more grams (2 slices of whole-wheat bread, 2 servings of most fruits or vegetables, or 1 bowl of high-fiber cereal) per day is best. Too rapid an increase in fiber may result in constipation, flatulence, and bloating.   Drink enough water and fluids to keep your urine clear or pale yellow. Water, juice, or caffeine-free drinks are recommended. Not drinking enough fluid may cause constipation.   Eat a variety of high-fiber foods rather than one type of fiber.   Try to increase your intake of fiber through using high-fiber foods rather than fiber pills or supplements that contain small amounts of fiber.   The goal is to change the types of food eaten. Do not supplement your present diet with high-fiber foods, but replace foods in your present diet.   INCLUDE A VARIETY OF FIBER SOURCES  Replace refined and processed grains with whole grains, canned fruits with fresh fruits, and incorporate other fiber sources. White rice, white breads, and most bakery goods contain little or no fiber.   Brown whole-grain rice, buckwheat oats, and many fruits and vegetables are all good sources of fiber. These include: broccoli, Brussels sprouts, cabbage, cauliflower, beets, sweet potatoes, white potatoes (skin on), carrots, tomatoes, eggplant, squash, berries, fresh fruits, and dried fruits.   Cereals appear to be the richest source of fiber. Cereal fiber is found in whole grains and bran. Bran is the fiber-rich outer coat of cereal grain, which is largely removed in refining. In whole-grain cereals, the bran remains. In breakfast cereals, the largest amount of fiber is found in those with "bran" in their names. The fiber content is sometimes indicated on the label.   You may need to include additional fruits and vegetables each day.   In baking, for 1 cup white flour, you may use the following substitutions:   1 cup whole-wheat flour  minus 2 tablespoons.   1/2 cup white flour plus 1/2 cup whole-wheat flour.   Polyps, Colon  A polyp is extra tissue that grows inside your body. Colon polyps grow in the large intestine. The large intestine, also called the colon, is part of your digestive system. It is a long, hollow tube at the end of your digestive tract where your body makes and stores stool. Most polyps are not dangerous. They are benign. This means they are not cancerous. But over time, some types of polyps can turn into cancer. Polyps that are smaller than a pea are usually not harmful. But larger polyps could someday become or may already be cancerous. To be safe, doctors remove all polyps and test them.   PREVENTION There is not one sure way to prevent polyps. You might be able to lower your risk of getting them if you:  Eat more fruits and vegetables and less fatty food.   Do not smoke.   Avoid alcohol.   Exercise every day.   Lose weight if you are overweight.  Eating more calcium and folate can also lower your risk of getting polyps. Some foods that are rich in calcium are milk, cheese, and broccoli. Some foods that are rich in folate are chickpeas, kidney beans, and spinach.    Diverticulosis Diverticulosis is a common condition that develops when small pouches (diverticula) form in the wall of the colon. The risk of diverticulosis increases with age. It happens more often in people who eat a low-fiber diet. Most individuals with diverticulosis have no symptoms. Those individuals with symptoms usually experience belly (abdominal) pain, constipation, or loose stools (diarrhea).  HOME CARE INSTRUCTIONS  Increase the amount of fiber in your diet as directed by your caregiver or dietician. This may reduce symptoms of diverticulosis.   Drink at least 6 to 8 glasses of water each day to prevent constipation.   Try not to strain when you have a bowel movement.   Avoiding nuts and seeds to prevent  complications is NOT NECESSARY.     FOODS HAVING HIGH FIBER CONTENT INCLUDE:  Fruits. Apple, peach, pear, tangerine, raisins, prunes.   Vegetables. Brussels sprouts, asparagus, broccoli, cabbage, carrot, cauliflower, romaine lettuce, spinach, summer squash, tomato, winter squash, zucchini.   Starchy Vegetables. Baked beans, kidney beans, lima beans, split peas, lentils, potatoes (with skin).   Grains. Whole wheat bread, brown rice, bran flake cereal, plain oatmeal, white rice, shredded wheat, bran muffins.    SEEK IMMEDIATE MEDICAL CARE IF:  You develop increasing pain or severe bloating.   You have an oral temperature above 101F.   You develop vomiting or bowel movements that are bloody or black.   Hemorrhoids Hemorrhoids are dilated (enlarged) veins around the rectum. Sometimes clots will form in the veins. This makes them swollen and painful. These are called thrombosed hemorrhoids. Causes of hemorrhoids include:  Constipation.   Straining to have a bowel movement.   HEAVY LIFTING  HOME CARE INSTRUCTIONS  Eat a well balanced diet and drink 6 to 8 glasses of water every day to avoid constipation. You may also use a bulk laxative.   Avoid straining to have bowel movements.   Keep anal area dry and clean.   Do not use a donut shaped pillow or sit on the toilet for long periods. This increases blood pooling and pain.   Move your bowels when your body has the urge; this will require less straining and will decrease pain and pressure.

## 2016-05-23 ENCOUNTER — Telehealth: Payer: Self-pay | Admitting: Gastroenterology

## 2016-05-23 NOTE — Telephone Encounter (Signed)
PLEASE CALL PT. HER POLYP WAS REMOVED. WHEN PATHOLOGY RECEIVED SPECIMEN BOTTLE IT DID NOT CONTAIN THE POLYP. SHE SHOULD HAVE A COLONOSCOPY IN 5 YEARS, DX:  PERSONAL HISTORY OF POLYPS.

## 2016-05-24 NOTE — Telephone Encounter (Signed)
Reminder in epic °

## 2016-05-25 NOTE — Telephone Encounter (Signed)
Pt is aware.  

## 2016-05-26 ENCOUNTER — Encounter (HOSPITAL_COMMUNITY): Payer: Self-pay | Admitting: Gastroenterology

## 2016-06-05 ENCOUNTER — Other Ambulatory Visit: Payer: Self-pay | Admitting: Family Medicine

## 2016-06-06 ENCOUNTER — Other Ambulatory Visit: Payer: Self-pay | Admitting: Family Medicine

## 2016-07-18 ENCOUNTER — Other Ambulatory Visit: Payer: Self-pay | Admitting: Adult Health

## 2016-08-09 ENCOUNTER — Other Ambulatory Visit: Payer: BLUE CROSS/BLUE SHIELD | Admitting: Adult Health

## 2016-08-10 ENCOUNTER — Encounter: Payer: Self-pay | Admitting: Adult Health

## 2016-08-10 ENCOUNTER — Ambulatory Visit (INDEPENDENT_AMBULATORY_CARE_PROVIDER_SITE_OTHER): Payer: BLUE CROSS/BLUE SHIELD | Admitting: Adult Health

## 2016-08-10 VITALS — BP 118/76 | HR 73 | Ht 66.0 in | Wt 147.0 lb

## 2016-08-10 DIAGNOSIS — Z01419 Encounter for gynecological examination (general) (routine) without abnormal findings: Secondary | ICD-10-CM

## 2016-08-10 DIAGNOSIS — Z79899 Other long term (current) drug therapy: Secondary | ICD-10-CM

## 2016-08-10 DIAGNOSIS — Z79818 Long term (current) use of other agents affecting estrogen receptors and estrogen levels: Secondary | ICD-10-CM

## 2016-08-10 DIAGNOSIS — Z1212 Encounter for screening for malignant neoplasm of rectum: Secondary | ICD-10-CM

## 2016-08-10 DIAGNOSIS — Z1211 Encounter for screening for malignant neoplasm of colon: Secondary | ICD-10-CM

## 2016-08-10 LAB — HEMOCCULT GUIAC POC 1CARD (OFFICE): Fecal Occult Blood, POC: NEGATIVE

## 2016-08-10 MED ORDER — ESTRADIOL 0.1 MG/24HR TD PTTW: MEDICATED_PATCH | TRANSDERMAL | 12 refills | Status: DC

## 2016-08-10 MED ORDER — IBUPROFEN 800 MG PO TABS
800.0000 mg | ORAL_TABLET | Freq: Three times a day (TID) | ORAL | 3 refills | Status: DC | PRN
Start: 2016-08-10 — End: 2018-03-13

## 2016-08-10 NOTE — Progress Notes (Signed)
Patient ID: Angela Salas, female   DOB: [birth year], 51 y.o.   MRN: [id number] History of Present Illness: Angela Salas is a 50 year old white female, married, G2P2, sp hysterectomy in for well woman gyn exam. PCP is [facility].   Current Medications, Allergies, Past Medical History, Past Surgical History, Family History and Social History were reviewed in [employer] record.     Review of Systems: Patient denies any  hearing loss, fatigue, blurred vision, shortness of breath, chest pain, abdominal pain, problems with bowel movements, urination, or intercourse. No joint pain or mood swings.Has occasional headaches and is snappy a times.  She had colonoscopy this year and the specimen was lost she said.   Physical Exam:BP 118/76 (BP Location: Right Arm, Patient Position: Sitting, Cuff Size: Normal)   Pulse 73   Ht  (1.676 m)   Wt 147 lb (66.7 kg)   BMI 23.73 kg/m  General:  Well developed, well nourished, no acute distress Skin:  Warm and dry Neck:  Midline trachea, normal thyroid, good ROM, no lymphadenopathy Lungs; Clear to auscultation bilaterally Breast:  No dominant palpable mass, retraction, or nipple discharge Cardiovascular: Regular rate and rhythm Abdomen:  Soft, non tender, no hepatosplenomegaly Pelvic:  External genitalia is normal in appearance, no lesions.  The vagina is normal in appearance. Urethra has no lesions or masses. The cervix and uterus are absent. No adnexal masses or tenderness noted.Bladder is non tender, no masses felt. Rectal: Good sphincter tone, no polyps, or hemorrhoids felt.  Hemoccult negative. Extremities/musculoskeletal:  No swelling or varicosities noted, no clubbing or cyanosis Psych:  No mood changes, alert and cooperative,seems happy PHQ 2 score 0.  Impression: 1. Well woman exam with routine gynecological exam   2. Current use of estrogen therapy   3. Screening for colorectal cancer       Plan: Meds ordered this  encounter  Medications  . estradiol (VIVELLE-DOT) 0.1 MG/24HR patch    Sig: apply 1 patch two times a week as directed    Dispense:  8 patch    Refill:  12    Order Specific Question:   Supervising Provider    Answer:   Despina Hidden, LUTHER H [[id number]]  . ibuprofen (ADVIL,MOTRIN) 800 MG tablet    Sig: Take 1 tablet (800 mg total) by mouth every 8 (eight) hours as needed.    Dispense:  60 tablet    Refill:  3    Order Specific Question:   Supervising Provider    Answer:   Lazaro Arms [[id number]]   Physical in 1 year Mammogram yearly Labs with PCP Colonoscopy per Dr Darrick Penna

## 2016-12-15 ENCOUNTER — Other Ambulatory Visit: Payer: Self-pay | Admitting: Obstetrics and Gynecology

## 2016-12-15 DIAGNOSIS — Z1231 Encounter for screening mammogram for malignant neoplasm of breast: Secondary | ICD-10-CM

## 2017-02-13 ENCOUNTER — Ambulatory Visit
Admission: RE | Admit: 2017-02-13 | Discharge: 2017-02-13 | Disposition: A | Discharge status: Home / Self Care | Payer: BLUE CROSS/BLUE SHIELD | Source: Ambulatory Visit | Attending: Obstetrics and Gynecology | Admitting: Obstetrics and Gynecology

## 2017-02-13 DIAGNOSIS — Z1231 Encounter for screening mammogram for malignant neoplasm of breast: Secondary | ICD-10-CM

## 2017-03-31 ENCOUNTER — Encounter: Payer: Self-pay | Admitting: Nurse Practitioner

## 2017-03-31 ENCOUNTER — Ambulatory Visit (INDEPENDENT_AMBULATORY_CARE_PROVIDER_SITE_OTHER): Payer: BLUE CROSS/BLUE SHIELD | Admitting: Nurse Practitioner

## 2017-03-31 VITALS — BP 132/80 | HR 85 | Ht 65.0 in | Wt 146.4 lb

## 2017-03-31 DIAGNOSIS — G43009 Migraine without aura, not intractable, without status migrainosus: Secondary | ICD-10-CM

## 2017-03-31 DIAGNOSIS — M62838 Other muscle spasm: Secondary | ICD-10-CM

## 2017-03-31 DIAGNOSIS — I10 Essential (primary) hypertension: Secondary | ICD-10-CM | POA: Diagnosis not present

## 2017-03-31 DIAGNOSIS — F411 Generalized anxiety disorder: Secondary | ICD-10-CM

## 2017-03-31 DIAGNOSIS — F430 Acute stress reaction: Secondary | ICD-10-CM

## 2017-03-31 MED ORDER — HYDROCHLOROTHIAZIDE 25 MG PO TABS: 25.0000 mg | ORAL_TABLET | Freq: Every day | ORAL | 12 refills | Status: DC

## 2017-03-31 MED ORDER — ESCITALOPRAM OXALATE 10 MG PO TABS: 10.0000 mg | ORAL_TABLET | Freq: Every day | ORAL | 2 refills | Status: DC

## 2017-03-31 MED ORDER — POTASSIUM CHLORIDE ER 10 MEQ PO TBCR: 10.0000 meq | EXTENDED_RELEASE_TABLET | Freq: Every day | ORAL | 12 refills | Status: DC

## 2017-03-31 NOTE — Patient Instructions (Signed)
Angela Salas at [facility]  

## 2017-04-02 ENCOUNTER — Encounter: Payer: Self-pay | Admitting: Nurse Practitioner

## 2017-04-02 DIAGNOSIS — F430 Acute stress reaction: Secondary | ICD-10-CM

## 2017-04-02 DIAGNOSIS — F411 Generalized anxiety disorder: Secondary | ICD-10-CM | POA: Insufficient documentation

## 2017-04-02 NOTE — Progress Notes (Addendum)
Subjective:  Presents for recheck on HTN. Compliant with meds. No CP/ischemic type pain or SOB. No TIA symptomatology. Gets regular preventive health physicals. Has had exacerbation of her migraines. No change in symptomatology. Having about 3-4 per week. Under significant stress due to family issues. Sleeping well. Denies suicidal or homicidal thoughts or ideation. Taking frequent analgesics. Also having tight tender muscles along the neck area. Describes stress as 7-8/10.   Objective:   BP 132/80 (BP Location: Right Arm, Patient Position: Sitting)   Pulse 85   Ht 5\' 5"  (1.651 m)   Wt 146 lb 6.4 oz (66.4 kg)   SpO2 97%   BMI 24.36 kg/m  NAD. Alert, oriented. Mildly anxious affect. Fatigued in appearance. Lungs clear. Heart RRR. Carotids no bruits or thrills. BP on recheck 136/90 left arm sitting. LE: no edema. Very tight tender muscles noted along the neck and upper back area.   Assessment:   Problem List Items Addressed This Visit      Cardiovascular and Mediastinum   Hypertension - Primary   Relevant Medications   hydrochlorothiazide (HYDRODIURIL) 25 MG tablet   Migraines   Relevant Medications   escitalopram (LEXAPRO) 10 MG tablet   hydrochlorothiazide (HYDRODIURIL) 25 MG tablet     Other   Anxiety as acute reaction to exceptional stress   Relevant Medications   escitalopram (LEXAPRO) 10 MG tablet    Other Visit Diagnoses    Muscle spasms of neck           Plan:   Meds ordered this encounter  Medications  . escitalopram (LEXAPRO) 10 MG tablet    Sig: Take 1 tablet (10 mg total) by mouth daily.    Dispense:  30 tablet    Refill:  2    Order Specific Question:   Supervising Provider    Answer:   Merlyn AlbertLUKING, WILLIAM S [[id number]]  . hydrochlorothiazide (HYDRODIURIL) 25 MG tablet    Sig: Take 1 tablet (25 mg total) by mouth daily.    Dispense:  30 tablet    Refill:  12    Order Specific Question:   Supervising Provider    Answer:   Merlyn AlbertLUKING, WILLIAM S [[id number]]  . potassium  chloride (K-DUR) 10 MEQ tablet    Sig: Take 1 tablet (10 mEq total) by mouth daily.    Dispense:  30 tablet    Refill:  12    Order Specific Question:   Supervising Provider    Answer:   Merlyn AlbertLUKING, WILLIAM S [[id number]]   Start Lexapro as directed. DC med and contact office if any adverse effects. Discussed importance of stress reduction. Recommend support group for family with mental illness such as [facility].   Recommend massage therapy. Slowly wean off analgesics.  Return for follow up in 4-6 weeks for recheck and labs. 25 minutes was spent with the patient. Greater than half the time was spent in discussion and answering questions and counseling regarding the issues that the patient came in for today.

## 2017-04-26 ENCOUNTER — Encounter: Payer: Self-pay | Admitting: Nurse Practitioner

## 2017-04-26 NOTE — Telephone Encounter (Signed)
Nurses please let the patient know that Angela Salas is out of the office this week- Angela Salas is more aware of the patient's situation and would be more familiar with trying other medications.  It would be helpful if the patient could tell us/Carolyn what side effects she had with Lexapro because this can influence what other choices Angela Salas might have for her.  Also please have the patient clarify is she wanting a medicine to help with anxiety/stress related symptoms or medication to help with her headaches?(If the patient is comfortable with waiting for Angela Salas please find out this additional information forwarded to [name] so she can handle it when she comes back)

## 2017-04-27 NOTE — Telephone Encounter (Signed)
Patient stated she had tingling in her fingers and was extremely emotional on lexapro

## 2017-05-11 ENCOUNTER — Encounter: Payer: Self-pay | Admitting: Nurse Practitioner

## 2017-05-11 ENCOUNTER — Ambulatory Visit: Payer: BLUE CROSS/BLUE SHIELD | Admitting: Nurse Practitioner

## 2017-05-11 VITALS — BP 118/84 | Ht 65.0 in | Wt 145.4 lb

## 2017-05-11 DIAGNOSIS — F411 Generalized anxiety disorder: Secondary | ICD-10-CM | POA: Diagnosis not present

## 2017-05-11 DIAGNOSIS — F430 Acute stress reaction: Secondary | ICD-10-CM | POA: Diagnosis not present

## 2017-05-11 MED ORDER — SERTRALINE HCL 50 MG PO TABS: 50.0000 mg | ORAL_TABLET | Freq: Every day | ORAL | 2 refills | Status: DC

## 2017-05-13 ENCOUNTER — Encounter: Payer: Self-pay | Admitting: Nurse Practitioner

## 2017-05-13 NOTE — Progress Notes (Signed)
Subjective:  Presents for recheck. Continues to be under significant stress due to family issues. Just found out her daughter is pregnant which has added to her stress since last visit. Was unable to take Lexapro due to side effects. Would like to try a different medication. Has excellent support system including her husband.    Objective:   BP 118/84   Ht 5\' 5"  (1.651 m)   Wt 145 lb 6.4 oz (66 kg)   BMI 24.20 kg/m  NAD. Alert, oriented. Mildly anxious affect. Thoughts logical, coherent and relevant. Dressed appropriately. Making good eye contact.   Assessment:   Problem List Items Addressed This Visit      Other   Anxiety as acute reaction to exceptional stress - Primary   Relevant Medications   sertraline (ZOLOFT) 50 MG tablet       Plan:   Meds ordered this encounter  Medications  . sertraline (ZOLOFT) 50 MG tablet    Sig: Take 1 tablet (50 mg total) by mouth daily.    Dispense:  30 tablet    Refill:  2    Order Specific Question:   Supervising Provider    Answer:   Merlyn AlbertLUKING, WILLIAM S [[id number]]   DC med and contact office if any problems. Plan to slowly titrate med if needed.  Return in about 3 months (around 08/08/2017) for recheck.

## 2017-06-19 ENCOUNTER — Encounter: Payer: Self-pay | Admitting: Family Medicine

## 2017-06-19 ENCOUNTER — Ambulatory Visit: Payer: BLUE CROSS/BLUE SHIELD | Admitting: Family Medicine

## 2017-06-19 VITALS — BP 128/76 | Temp 98.4°F | Ht 65.0 in | Wt 143.0 lb

## 2017-06-19 DIAGNOSIS — G43001 Migraine without aura, not intractable, with status migrainosus: Secondary | ICD-10-CM | POA: Diagnosis not present

## 2017-06-19 MED ORDER — ONDANSETRON 4 MG PO TBDP: ORAL_TABLET | ORAL | 2 refills | Status: DC

## 2017-06-19 MED ORDER — HYDROCODONE-ACETAMINOPHEN 5-325 MG PO TABS: ORAL_TABLET | ORAL | 0 refills | Status: DC

## 2017-06-19 NOTE — Progress Notes (Signed)
   Subjective:    Patient ID: Angela Salas, female    DOB: 1966-01-08, 52 y.o.   MRN: [id number]  Migraine   This is a recurrent problem. Episode onset: 3 days. Associated symptoms include nausea and photophobia. Treatments tried: excedrin migaine, aleve. The treatment provided no relief.   Took two aleave and took excedrin    lefft side ' pulsating   Nausea  No vom iting   t not taking in po intske vey well   No nausea meds  No pain meds, has used the 800 mg ibu prn   Still ongoing h a  And discomfort       Review of Systems  Eyes: Positive for photophobia.  Gastrointestinal: Positive for nausea.       Objective:   Physical Exam  Alert vitals stable, mild malaise with evident photophobia.  No focal neurological deficits blood pressure good on repeat. HEENT normal. Lungs clear. Heart regular rate and rhythm.       Assessment & Plan:  Impression migraine headache.  Occurs very sporadically.  Patient would just like to break this particular 1.  Hydrocodone plus Zofran prescribed.  Warning signs discussed if continue to recur frequently will need to discuss prophylaxis

## 2017-07-27 ENCOUNTER — Encounter: Payer: Self-pay | Admitting: Family Medicine

## 2017-07-27 ENCOUNTER — Ambulatory Visit: Payer: BLUE CROSS/BLUE SHIELD | Admitting: Family Medicine

## 2017-07-27 VITALS — BP 118/84 | Ht 65.0 in | Wt 143.0 lb

## 2017-07-27 DIAGNOSIS — M545 Low back pain, unspecified: Secondary | ICD-10-CM

## 2017-07-27 DIAGNOSIS — R1032 Left lower quadrant pain: Secondary | ICD-10-CM

## 2017-07-27 DIAGNOSIS — M7061 Trochanteric bursitis, right hip: Secondary | ICD-10-CM

## 2017-07-27 DIAGNOSIS — R1031 Right lower quadrant pain: Secondary | ICD-10-CM | POA: Diagnosis not present

## 2017-07-27 LAB — POCT URINALYSIS DIPSTICK
Spec Grav, UA: 1.02 (ref 1.010–1.025)
pH, UA: 5 (ref 5.0–8.0)

## 2017-07-27 MED ORDER — DICLOFENAC SODIUM 75 MG PO TBEC: 75.0000 mg | DELAYED_RELEASE_TABLET | Freq: Two times a day (BID) | ORAL | 0 refills | Status: DC

## 2017-07-27 NOTE — Progress Notes (Signed)
   Subjective:    Patient ID: Angela PeerAmanda A Mccuen, female    DOB: October 12, 1965, 52 y.o.   MRN: [id number]  Leg Pain   The pain is present in the right thigh and right leg. The quality of the pain is described as shooting. The pain is at a severity of 8/10 (when sitting). Associated symptoms include numbness and tingling. Associated symptoms comments: Sometimes leg feels numb and tingling. Exacerbated by: sitting. She has tried ice, heat, acetaminophen and NSAIDs for the symptoms. The treatment provided mild relief.  No injury noted. Pain in groin and lower back.  The patient relates over the past couple weeks has had low back pressure feeling she also relates its worse when she is sitting she denies fever chills sweats she does state the pain radiates around the right hip region into the groin and also sometimes down into the leg occasionally having a sharp pain down the legs sometimes numbness or tingling into the leg denies any other particular troubles denies loss of bowel or bladder control denies dysuria denies rectal bleeding vomiting She feels better when she is standing Results for orders placed or performed in visit on 07/27/17  POCT Urinalysis Dipstick  Result Value Ref Range   Color, UA     Clarity, UA     Glucose, UA     Bilirubin, UA     Ketones, UA     Spec Grav, UA 1.020 1.010 - 1.025   Blood, UA     pH, UA 5.0 5.0 - 8.0   Protein, UA     Urobilinogen, UA  0.2 or 1.0 E.U./dL   Nitrite, UA     Leukocytes, UA  Negative   Appearance     Odor      Review of Systems  Neurological: Positive for tingling and numbness.  Denies any weakness denies abdominal symptoms denies nausea vomiting denies congestion drainage coughing denies fever chills     Objective:   Physical Exam Respiratory rate normal lungs clear heart regular pulse normal low back subjective tenderness in the lower back good flexibility negative straight leg raise positive tenderness in the right trochanter region also  some tenderness with internal and external rotation of the right hip with pain radiating into the groin region abdomen minimal soreness in the lower abdomen       Assessment & Plan:  Lumbar pain Right groin strain Sciatica symptoms No sign of muscle weakness  Possible trochanter bursitis  Lower abdominal discomfort negative for UTI-see above  Patient will do stretching exercises anti-inflammatories hold off on x-rays currently give us update within 7 to 10 days  If not resolved over the course the next 8 to 10 weeks may need referral to orthopedics with spine specialist  Patient understands the importance of giving us feedback in 8 to 10 days or how she is doing

## 2017-08-07 ENCOUNTER — Ambulatory Visit: Payer: BLUE CROSS/BLUE SHIELD | Admitting: Nurse Practitioner

## 2017-08-22 ENCOUNTER — Telehealth: Payer: Self-pay | Admitting: Adult Health

## 2017-08-22 MED ORDER — ESTRADIOL 0.1 MG/24HR TD PTTW: MEDICATED_PATCH | TRANSDERMAL | 12 refills | Status: DC

## 2017-08-22 NOTE — Telephone Encounter (Signed)
[facility] on [address] told her that they had sent request many times with no response / she is out can we call in/ hormone patches

## 2017-08-22 NOTE — Telephone Encounter (Signed)
Pt aware patches refilled

## 2017-10-02 DIAGNOSIS — H66002 Acute suppurative otitis media without spontaneous rupture of ear drum, left ear: Secondary | ICD-10-CM | POA: Diagnosis not present

## 2017-10-02 DIAGNOSIS — J069 Acute upper respiratory infection, unspecified: Secondary | ICD-10-CM | POA: Diagnosis not present

## 2017-10-02 DIAGNOSIS — Z792 Long term (current) use of antibiotics: Secondary | ICD-10-CM | POA: Diagnosis not present

## 2018-01-08 ENCOUNTER — Other Ambulatory Visit: Payer: Self-pay | Admitting: Obstetrics and Gynecology

## 2018-01-08 DIAGNOSIS — Z1231 Encounter for screening mammogram for malignant neoplasm of breast: Secondary | ICD-10-CM

## 2018-01-31 DIAGNOSIS — H66002 Acute suppurative otitis media without spontaneous rupture of ear drum, left ear: Secondary | ICD-10-CM | POA: Diagnosis not present

## 2018-01-31 DIAGNOSIS — Z23 Encounter for immunization: Secondary | ICD-10-CM | POA: Diagnosis not present

## 2018-02-16 ENCOUNTER — Ambulatory Visit
Admission: RE | Admit: 2018-02-16 | Discharge: 2018-02-16 | Disposition: A | Discharge status: Home / Self Care | Payer: BLUE CROSS/BLUE SHIELD | Source: Ambulatory Visit | Attending: Obstetrics and Gynecology | Admitting: Obstetrics and Gynecology

## 2018-02-16 DIAGNOSIS — Z1231 Encounter for screening mammogram for malignant neoplasm of breast: Secondary | ICD-10-CM

## 2018-03-13 ENCOUNTER — Other Ambulatory Visit: Payer: Self-pay | Admitting: Adult Health

## 2018-05-10 ENCOUNTER — Other Ambulatory Visit: Payer: Self-pay | Admitting: Adult Health

## 2018-07-17 ENCOUNTER — Ambulatory Visit (INDEPENDENT_AMBULATORY_CARE_PROVIDER_SITE_OTHER): Payer: BLUE CROSS/BLUE SHIELD | Admitting: Family Medicine

## 2018-07-17 ENCOUNTER — Other Ambulatory Visit: Payer: Self-pay

## 2018-07-17 DIAGNOSIS — N23 Unspecified renal colic: Secondary | ICD-10-CM

## 2018-07-17 MED ORDER — OXYCODONE-ACETAMINOPHEN 5-325 MG PO TABS: 1.0000 | ORAL_TABLET | ORAL | 0 refills | Status: AC | PRN

## 2018-07-17 MED ORDER — FLUCONAZOLE 150 MG PO TABS: 150.0000 mg | ORAL_TABLET | Freq: Once | ORAL | 2 refills | Status: AC

## 2018-07-17 MED ORDER — CEFPROZIL 500 MG PO TABS: 500.0000 mg | ORAL_TABLET | Freq: Two times a day (BID) | ORAL | 0 refills | Status: DC

## 2018-07-17 MED ORDER — TAMSULOSIN HCL 0.4 MG PO CAPS: 0.4000 mg | ORAL_CAPSULE | Freq: Every day | ORAL | 1 refills | Status: DC

## 2018-07-17 NOTE — Progress Notes (Signed)
   Subjective:    Patient ID: Angela Salas, female    DOB: Dec 28, 1965, 53 y.o.   MRN: [id number]  HPI  Video and audio accomplished Patient at home I was present in the office   Patient with dysuria for 4-5 days. Patient has a history of kidney stones and wonders if she has a kidney stone and a UTI. Patient reports burning and itching.  Patient with significant discomfort and pain on her right lower back region.  This started up over the past few days.  At times the pain is very severe sharp painful.  Radiates to the groin.  Denies high fever chills sweats.  No vomiting.  No hematuria.  Some discomfort and itching  Virtual Visit via Video Note  I connected with Angela Salas on 07/17/18 at  1:10 PM EDT by a video enabled telemedicine application and verified that I am speaking with the correct person using two identifiers.   I discussed the limitations of evaluation and management by telemedicine and the availability of in person appointments. The patient expressed understanding and agreed to proceed.  History of Present Illness:    Observations/Objective:   Assessment and Plan:   Follow Up Instructions:    I discussed the assessment and treatment plan with the patient. The patient was provided an opportunity to ask questions and all were answered. The patient agreed with the plan and demonstrated an understanding of the instructions.   The patient was advised to call back or seek an in-person evaluation if the symptoms worsen or if the condition fails to improve as anticipated.  I provided 15 minutes of non-face-to-face time during this encounter.     Review of Systems     Objective:   Physical Exam Physical exam unable to accomplish via video   Patient will give [place] update in 48 hours on how she is doing we are trying her best to keep her out of the ER Previously patient had to have lithotripsy to treat her kidney stone May need to get her back in with urology  15 minutes was spent with patient today discussing healthcare issues which they came.  More than 50% of this visit-total duration of visit-was spent in counseling and coordination of care.  Please see diagnosis regarding the focus of this coordination and care     Assessment & Plan:  Probable kidney stone based upon her history of having 1 in 2010 and her symptomatology Rationale of treatment discussed Flomax 1 daily Cefzil twice daily for the next 10 days Diflucan as needed for any yeast infection Percocet 5 mg / 325 mg 1 to 2 tablets every 4 hours as needed for severe pain Caution drowsiness Follow-up if progressive troubles or worse

## 2018-09-14 ENCOUNTER — Other Ambulatory Visit: Payer: Self-pay | Admitting: Adult Health

## 2018-11-27 DIAGNOSIS — G5761 Lesion of plantar nerve, right lower limb: Secondary | ICD-10-CM | POA: Diagnosis not present

## 2018-11-27 DIAGNOSIS — M25579 Pain in unspecified ankle and joints of unspecified foot: Secondary | ICD-10-CM | POA: Diagnosis not present

## 2018-11-27 DIAGNOSIS — M79671 Pain in right foot: Secondary | ICD-10-CM | POA: Diagnosis not present

## 2018-12-07 ENCOUNTER — Other Ambulatory Visit: Payer: Self-pay | Admitting: Nurse Practitioner

## 2018-12-07 NOTE — Telephone Encounter (Signed)
May have 30-day needs follow-up visit office visit or virtual

## 2018-12-25 DIAGNOSIS — M79671 Pain in right foot: Secondary | ICD-10-CM | POA: Diagnosis not present

## 2018-12-25 DIAGNOSIS — G5761 Lesion of plantar nerve, right lower limb: Secondary | ICD-10-CM | POA: Diagnosis not present

## 2019-01-01 ENCOUNTER — Other Ambulatory Visit: Payer: Self-pay

## 2019-01-01 ENCOUNTER — Ambulatory Visit (INDEPENDENT_AMBULATORY_CARE_PROVIDER_SITE_OTHER): Payer: BC Managed Care – PPO | Admitting: Family Medicine

## 2019-01-01 DIAGNOSIS — I10 Essential (primary) hypertension: Secondary | ICD-10-CM

## 2019-01-01 NOTE — Progress Notes (Signed)
Subjective:    Patient ID: Angela Salas, female    DOB: Sep 09, 1965, 53 y.o.   MRN: [id number] Initially was a virtual visit but patient was brought to the office because of having dizzy spells she does not have COVID symptoms Hypertension This is a chronic problem. The current episode started more than 1 year ago. Pertinent negatives include no chest pain or shortness of breath. Risk factors for coronary artery disease include post-menopausal state. Treatments tried: hctz. There are no compliance problems.    Patient states she has not taken her blood pressure recently but has been having dizzy spells for 3 weeks She noticed that she gets dizzy when she stands up when she moves around sometimes room sort of spins this is been off and on over the past few weeks also the past few days has had a right-sided headache no double vision nausea vomiting She describes the spells as feeling like the room and sort of turning on her spinning on her some it lasts a few minutes to a few moments then gets better it is more so when she lays down is more so when she turns her head she denies any type of underlying issues denies stress anxiety depression trying to eat healthy drink plenty of liquids. PMH [id number] Virtual Visit via Video Note  I connected with Angela Salas on 01/01/19 at 11:00 AM EDT by a video enabled telemedicine application and verified that I am speaking with the correct person using two identifiers.  Location: Patient: home Provider: office   I discussed the limitations of evaluation and management by telemedicine and the availability of in person appointments. The patient expressed understanding and agreed to proceed.  History of Present Illness:    Observations/Objective:   Assessment and Plan:   Follow Up Instructions:    I discussed the assessment and treatment plan with the patient. The patient was provided an opportunity to ask questions and all were answered. The  patient agreed with the plan and demonstrated an understanding of the instructions.   The patient was advised to call back or seek an in-person evaluation if the symptoms worsen or if the condition fails to improve as anticipated. 15 I provided 15 minutes of non-face-to-face time during this encounter.       Review of Systems  Constitutional: Negative for activity change, appetite change and fatigue.  HENT: Negative for congestion and rhinorrhea.   Respiratory: Negative for cough and shortness of breath.   Cardiovascular: Negative for chest pain and leg swelling.  Gastrointestinal: Negative for abdominal pain and diarrhea.  Endocrine: Negative for polydipsia and polyphagia.  Skin: Negative for color change.  Neurological: Positive for dizziness and light-headedness. Negative for weakness.  Psychiatric/Behavioral: Negative for behavioral problems and confusion.       Objective:   Physical Exam  Patient had virtual visit Appears to be in no distress Atraumatic Neuro able to relate and oriented No apparent resp distress Color normal       Assessment & Plan:  Elevated blood pressure Continue medications Follow-up tomorrow for [place] to check blood pressure in the office COVID screening negative  Addendum patient did come to the office we checked of blood pressure with her cuff and with ours and overall the readings were acceptable but could be better.  I do recommend patient to do some lab work to look at kidney function sodium potassium as well as sugar and cholesterol.  She will check her blood pressures on a regular  basis and update [place] within a month  I encouraged her to start initiating walking on a regular basis and minimizing salt intake

## 2019-01-02 ENCOUNTER — Encounter: Payer: Self-pay | Admitting: Family Medicine

## 2019-01-02 ENCOUNTER — Ambulatory Visit (INDEPENDENT_AMBULATORY_CARE_PROVIDER_SITE_OTHER): Payer: BC Managed Care – PPO | Admitting: Family Medicine

## 2019-01-02 ENCOUNTER — Other Ambulatory Visit: Payer: Self-pay

## 2019-01-02 VITALS — BP 124/78 | Temp 97.6°F | Ht 65.0 in | Wt 149.2 lb

## 2019-01-02 DIAGNOSIS — Z13220 Encounter for screening for lipoid disorders: Secondary | ICD-10-CM | POA: Diagnosis not present

## 2019-01-02 DIAGNOSIS — Z23 Encounter for immunization: Secondary | ICD-10-CM | POA: Diagnosis not present

## 2019-01-02 DIAGNOSIS — I10 Essential (primary) hypertension: Secondary | ICD-10-CM | POA: Diagnosis not present

## 2019-01-02 NOTE — Progress Notes (Signed)
   Subjective:    Patient ID: Angela Salas, female    DOB: [birth year], 53 y.o.   MRN: [id number]  HPI  Patient arrives for a follow up on blood pressure.  Patient was seen on 922 this is more of a courtesy follow-up blood pressure  Review of Systems  Constitutional: Negative for activity change, appetite change and fatigue.  HENT: Negative for congestion and rhinorrhea.   Respiratory: Negative for cough and shortness of breath.   Cardiovascular: Negative for chest pain and leg swelling.  Gastrointestinal: Negative for abdominal pain and diarrhea.  Endocrine: Negative for polydipsia and polyphagia.  Skin: Negative for color change.  Neurological: Negative for dizziness and weakness.  Psychiatric/Behavioral: Negative for behavioral problems and confusion.       Objective:   Physical Exam Vitals signs reviewed.  Constitutional:      General: She is not in acute distress. HENT:     Head: Normocephalic and atraumatic.  Eyes:     General:        Right eye: No discharge.        Left eye: No discharge.  Neck:     Trachea: No tracheal deviation.  Cardiovascular:     Rate and Rhythm: Normal rate and regular rhythm.     Heart sounds: Normal heart sounds. No murmur.  Pulmonary:     Effort: Pulmonary effort is normal. No respiratory distress.     Breath sounds: Normal breath sounds.  Lymphadenopathy:     Cervical: No cervical adenopathy.  Skin:    General: Skin is warm and dry.  Neurological:     Mental Status: She is alert.     Coordination: Coordination normal.  Psychiatric:        Behavior: Behavior normal.           Assessment & Plan:  Blood pressure readings with her cuff are reasonably close to what we would expect but her blood pressure reading today in the office is very good so therefore would not recommend adding additional medicine at this time  Patient will stick with a healthy diet regular physical activity and she will send [place] blood pressure readings in  approximately 1 month at that point time may potentially have to add additional medicines  She was encouraged to minimize salt and anti-inflammatories

## 2019-01-03 LAB — BASIC METABOLIC PANEL
BUN/Creatinine Ratio: 17 (ref 9–23)
BUN: 14 mg/dL (ref 6–24)
CO2: 26 mmol/L (ref 20–29)
Calcium: 9.1 mg/dL (ref 8.7–10.2)
Chloride: 103 mmol/L (ref 96–106)
Creatinine, Ser: 0.81 mg/dL (ref 0.57–1.00)
GFR calc Af Amer: 96 mL/min/{1.73_m2} (ref >59)
GFR calc non Af Amer: 83 mL/min/{1.73_m2} (ref >59)
Glucose: 87 mg/dL (ref 65–99)
Potassium: 4.6 mmol/L (ref 3.5–5.2)
Sodium: 141 mmol/L (ref 134–144)

## 2019-01-03 LAB — LIPID PANEL
Chol/HDL Ratio: 3.3 ratio (ref 0.0–4.4)
Cholesterol, Total: 206 mg/dL — ABNORMAL HIGH (ref 100–199)
HDL: 63 mg/dL (ref >39)
LDL Chol Calc (NIH): 129 mg/dL — ABNORMAL HIGH (ref 0–99)
Triglycerides: 79 mg/dL (ref 0–149)
VLDL Cholesterol Cal: 14 mg/dL (ref 5–40)

## 2019-01-03 LAB — MAGNESIUM: Magnesium: 2.1 mg/dL (ref 1.6–2.3)

## 2019-01-07 ENCOUNTER — Telehealth: Payer: Self-pay | Admitting: *Deleted

## 2019-01-07 NOTE — Telephone Encounter (Signed)
Called pt to tell her she needed her biometric formed signed before we could send it and also needs her height and weight and she states that since her bw came back good that dr scott told her that it could be inner ear causing the dizziness and wants to know what she can do for it.

## 2019-01-07 NOTE — Telephone Encounter (Signed)
The dizziness issue is more than likely interfering Meclizine can take the edge off but I would not recommend the medication during the day because it can cause drowsiness Typically will gradually go away But as we get older positional changes can trigger this I can mail her form on Epley maneuver which can help with the symptoms  If it does not get better over the course of the next few weeks referral to ENT would be wise She can let us know and we will be happy to do so  As for the biometric please talk with the patient we will be happy to sign it and fill it out.  She did bring with her but I do not recall keeping that form unless it is at the nurses station (I do not have that form)

## 2019-01-07 NOTE — Telephone Encounter (Signed)
Pt is coming in tomorrow to pick up form and will discuss then.

## 2019-01-08 ENCOUNTER — Other Ambulatory Visit: Payer: Self-pay | Admitting: Family Medicine

## 2019-01-09 ENCOUNTER — Encounter: Payer: Self-pay | Admitting: *Deleted

## 2019-01-09 NOTE — Telephone Encounter (Signed)
Discussed dr scott's message below with pt today when she came in to pick up form. Pt signed form and I faxed it and gave pt a copy and then sent form to medical records to scan into chart. I also gave pt the information sheets dr Nicki Reaper wanted mailed to her.

## 2019-01-09 NOTE — Telephone Encounter (Signed)
Pt did not come to get form. Will hold at nurse station until she comes to pickup.

## 2019-01-17 ENCOUNTER — Other Ambulatory Visit: Payer: Self-pay | Admitting: Obstetrics and Gynecology

## 2019-01-17 DIAGNOSIS — Z1231 Encounter for screening mammogram for malignant neoplasm of breast: Secondary | ICD-10-CM

## 2019-01-22 DIAGNOSIS — M79671 Pain in right foot: Secondary | ICD-10-CM | POA: Diagnosis not present

## 2019-01-22 DIAGNOSIS — G5761 Lesion of plantar nerve, right lower limb: Secondary | ICD-10-CM | POA: Diagnosis not present

## 2019-02-08 ENCOUNTER — Other Ambulatory Visit: Payer: Self-pay | Admitting: Family Medicine

## 2019-03-04 ENCOUNTER — Ambulatory Visit
Admission: RE | Admit: 2019-03-04 | Discharge: 2019-03-04 | Disposition: A | Discharge status: Home / Self Care | Payer: BC Managed Care – PPO | Source: Ambulatory Visit | Attending: Obstetrics and Gynecology | Admitting: Obstetrics and Gynecology

## 2019-03-04 ENCOUNTER — Other Ambulatory Visit: Payer: Self-pay

## 2019-03-04 DIAGNOSIS — Z1231 Encounter for screening mammogram for malignant neoplasm of breast: Secondary | ICD-10-CM

## 2019-04-30 ENCOUNTER — Other Ambulatory Visit: Payer: Self-pay | Admitting: Family Medicine

## 2019-04-30 NOTE — Telephone Encounter (Signed)
Last med check up 01/02/19

## 2019-04-30 NOTE — Telephone Encounter (Signed)
4 refills each needs virtual or in person visit by April

## 2019-05-01 NOTE — Telephone Encounter (Signed)
Please schedule and then route back to nurses to send in refill °

## 2019-05-01 NOTE — Telephone Encounter (Signed)
Pt already scheduled on 3/23

## 2019-07-02 ENCOUNTER — Ambulatory Visit (INDEPENDENT_AMBULATORY_CARE_PROVIDER_SITE_OTHER): Payer: BC Managed Care – PPO | Admitting: Family Medicine

## 2019-07-02 ENCOUNTER — Other Ambulatory Visit: Payer: Self-pay

## 2019-07-02 DIAGNOSIS — I10 Essential (primary) hypertension: Secondary | ICD-10-CM

## 2019-07-02 DIAGNOSIS — R42 Dizziness and giddiness: Secondary | ICD-10-CM

## 2019-07-02 MED ORDER — POTASSIUM CHLORIDE ER 10 MEQ PO TBCR: 10.0000 meq | EXTENDED_RELEASE_TABLET | Freq: Every day | ORAL | 1 refills | Status: DC

## 2019-07-02 MED ORDER — HYDROCHLOROTHIAZIDE 25 MG PO TABS: 25.0000 mg | ORAL_TABLET | Freq: Every morning | ORAL | 1 refills | Status: DC

## 2019-07-02 NOTE — Progress Notes (Signed)
   Subjective:    Patient ID: Angela Salas, female    DOB: January 23, 1966, 54 y.o.   MRN: [id number]  Hypertension This is a chronic problem. There are no compliance problems (eats healthy and exercises).   pt states her bp is good every time she checks it. Has not checked today.  States takes her medicine regular basis checks her blood pressure tries watch diet stays physically active denies feeling stressed out with the pandemic has already gotten her vaccine #1 Pt states no concerns today. States she has been doing well.  Occasionally gets vertigo symptoms although not severe no numbness or tingling associated with it Virtual Visit via Telephone Note  I connected with Angela Salas on 07/02/19 at  8:40 AM EDT by telephone and verified that I am speaking with the correct person using two identifiers.  Location: Patient: home Provider: office   I discussed the limitations, risks, security and privacy concerns of performing an evaluation and management service by telephone and the availability of in person appointments. I also discussed with the patient that there may be a patient responsible charge related to this service. The patient expressed understanding and agreed to proceed.   History of Present Illness:    Observations/Objective:   Assessment and Plan:   Follow Up Instructions:    I discussed the assessment and treatment plan with the patient. The patient was provided an opportunity to ask questions and all were answered. The patient agreed with the plan and demonstrated an understanding of the instructions.   The patient was advised to call back or seek an in-person evaluation if the symptoms worsen or if the condition fails to improve as anticipated.  I provided 1616 minutes of non-face-to-face time during this encounter.        Review of Systems Negative for any major issues except for intermittent dizziness where the room spins and feels a little dizzy lasts  only a few seconds at a time only a couple times per week over the next past week    Objective:   Physical Exam  Virtual visit unable to do exam      Assessment & Plan:  1. Essential hypertension Blood pressure under decent control according to patient she takes her medicine regular basis we will provide refills there is no need to do any lab work currently watch salt diet stay active  2. Vertigo Intermittent vertigo no unilateral numbness weakness felt to be mainly in her ear recommend Epley maneuver.  If unilateral numbness weakness or other problems ensue follow-up sooner  Follow-up in person in 6 months

## 2019-07-12 ENCOUNTER — Telehealth: Payer: Self-pay | Admitting: Family Medicine

## 2019-07-12 MED ORDER — NITROFURANTOIN MONOHYD MACRO 100 MG PO CAPS: 100.0000 mg | ORAL_CAPSULE | Freq: Two times a day (BID) | ORAL | 0 refills | Status: DC

## 2019-07-12 NOTE — Telephone Encounter (Signed)
Pt would like to know if something could be called in for a kidney infection. She is burning and itching x 3 days.   [facility]

## 2019-07-12 NOTE — Telephone Encounter (Signed)
Macrobid 100 bid 7 d 

## 2019-07-12 NOTE — Telephone Encounter (Signed)
Prescription sent electronically to pharmacy. Patient notified. 

## 2019-09-10 ENCOUNTER — Other Ambulatory Visit: Payer: Self-pay

## 2019-09-10 ENCOUNTER — Encounter: Payer: Self-pay | Admitting: Family Medicine

## 2019-09-10 ENCOUNTER — Ambulatory Visit: Payer: BC Managed Care – PPO | Admitting: Family Medicine

## 2019-09-10 VITALS — BP 124/80 | Temp 97.4°F | Ht 65.0 in | Wt 149.0 lb

## 2019-09-10 DIAGNOSIS — M545 Low back pain, unspecified: Secondary | ICD-10-CM

## 2019-09-10 LAB — POCT URINALYSIS DIPSTICK
Spec Grav, UA: <=1.005 — AB (ref 1.010–1.025)
pH, UA: 6 (ref 5.0–8.0)

## 2019-09-10 MED ORDER — ETODOLAC 400 MG PO TABS: 400.0000 mg | ORAL_TABLET | Freq: Two times a day (BID) | ORAL | 1 refills | Status: DC

## 2019-09-10 MED ORDER — CHLORZOXAZONE 500 MG PO TABS: 500.0000 mg | ORAL_TABLET | Freq: Three times a day (TID) | ORAL | 0 refills | Status: DC | PRN

## 2019-09-10 NOTE — Addendum Note (Signed)
Addended by: Metro Kung on: 09/10/2019 01:49 PM   Modules accepted: Orders

## 2019-09-10 NOTE — Progress Notes (Signed)
   Subjective:    Patient ID: Angela Salas, female    DOB: 1965-06-23, 54 y.o.   MRN: [id number]  Back Pain This is a new problem. Episode onset: 2 months. The pain is present in the lumbar spine (right side). Treatments tried: ibuprofen 800mg . The treatment provided no relief.  Has had it for about 2 months in the right lower spine does not radiate down the leg yesterday was so severe he could not get up and walk around.  Tried ibuprofen did not have much luck denies any high fever chills or sweats.  Denies any significant dysuria issues Results for orders placed or performed in visit on 09/10/19  POCT urinalysis dipstick  Result Value Ref Range   Color, UA     Clarity, UA     Glucose, UA     Bilirubin, UA     Ketones, UA     Spec Grav, UA <=1.005 (A) 1.010 - 1.025   Blood, UA     pH, UA 6.0 5.0 - 8.0   Protein, UA     Urobilinogen, UA     Nitrite, UA     Leukocytes, UA     Appearance     Odor     Increased pain with sitting  Review of Systems  Musculoskeletal: Positive for back pain.       Objective:   Physical Exam Lungs clear respiratory rate normal heart regular no murmurs low back mild tenderness in the right lower spine and SI joint negative straight leg raise has difficult time bending forward       Assessment & Plan:  Sacroiliitis recommend anti-inflammatory stretching exercises as well as muscle relaxer only use muscle relaxer when at home and at nighttime follow-up if progressive troubles or worse warning signs were discussed in detail patient is to give 11/10/19 update in approximately 10 days and how things are going certainly if they are getting worse may need physical therapy

## 2019-09-12 LAB — URINE CULTURE

## 2019-09-29 ENCOUNTER — Other Ambulatory Visit: Payer: Self-pay | Admitting: Adult Health

## 2019-11-20 ENCOUNTER — Emergency Department (HOSPITAL_COMMUNITY)
Admission: EM | Admit: 2019-11-20 | Discharge: 2019-11-20 | Disposition: A | Discharge status: Home / Self Care | Payer: BC Managed Care – PPO | Attending: Emergency Medicine | Admitting: Emergency Medicine

## 2019-11-20 ENCOUNTER — Emergency Department (HOSPITAL_COMMUNITY): Payer: BC Managed Care – PPO

## 2019-11-20 ENCOUNTER — Encounter (HOSPITAL_COMMUNITY): Payer: Self-pay

## 2019-11-20 ENCOUNTER — Ambulatory Visit
Admission: EM | Admit: 2019-11-20 | Discharge: 2019-11-20 | Disposition: A | Discharge status: Home / Self Care | Payer: BC Managed Care – PPO | Attending: Emergency Medicine | Admitting: Emergency Medicine

## 2019-11-20 ENCOUNTER — Other Ambulatory Visit: Payer: Self-pay

## 2019-11-20 DIAGNOSIS — H538 Other visual disturbances: Secondary | ICD-10-CM | POA: Diagnosis not present

## 2019-11-20 DIAGNOSIS — G43109 Migraine with aura, not intractable, without status migrainosus: Secondary | ICD-10-CM

## 2019-11-20 DIAGNOSIS — Z79899 Other long term (current) drug therapy: Secondary | ICD-10-CM | POA: Insufficient documentation

## 2019-11-20 DIAGNOSIS — I10 Essential (primary) hypertension: Secondary | ICD-10-CM | POA: Diagnosis not present

## 2019-11-20 DIAGNOSIS — R479 Unspecified speech disturbances: Secondary | ICD-10-CM | POA: Insufficient documentation

## 2019-11-20 DIAGNOSIS — R531 Weakness: Secondary | ICD-10-CM | POA: Diagnosis not present

## 2019-11-20 DIAGNOSIS — R200 Anesthesia of skin: Secondary | ICD-10-CM | POA: Diagnosis not present

## 2019-11-20 LAB — CBC
HCT: 47.2 % — ABNORMAL HIGH (ref 36.0–46.0)
Hemoglobin: 15.9 g/dL — ABNORMAL HIGH (ref 12.0–15.0)
MCH: 30.9 pg (ref 26.0–34.0)
MCHC: 33.7 g/dL (ref 30.0–36.0)
MCV: 91.7 fL (ref 80.0–100.0)
Platelets: 233 10*3/uL (ref 150–400)
RBC: 5.15 MIL/uL — ABNORMAL HIGH (ref 3.87–5.11)
RDW: 12 % (ref 11.5–15.5)
WBC: 7 10*3/uL (ref 4.0–10.5)
nRBC: 0 % (ref 0.0–0.2)

## 2019-11-20 LAB — COMPREHENSIVE METABOLIC PANEL
ALT: 30 U/L (ref 0–44)
AST: 36 U/L (ref 15–41)
Albumin: 4.7 g/dL (ref 3.5–5.0)
Alkaline Phosphatase: 75 U/L (ref 38–126)
Anion gap: 12 (ref 5–15)
BUN: 16 mg/dL (ref 6–20)
CO2: 25 mmol/L (ref 22–32)
Calcium: 9.5 mg/dL (ref 8.9–10.3)
Chloride: 100 mmol/L (ref 98–111)
Creatinine, Ser: 0.8 mg/dL (ref 0.44–1.00)
GFR calc Af Amer: >60 mL/min (ref >60)
GFR calc non Af Amer: >60 mL/min (ref >60)
Glucose, Bld: 88 mg/dL (ref 70–99)
Potassium: 4.6 mmol/L (ref 3.5–5.1)
Sodium: 137 mmol/L (ref 135–145)
Total Bilirubin: 1.3 mg/dL — ABNORMAL HIGH (ref 0.3–1.2)
Total Protein: 8.1 g/dL (ref 6.5–8.1)

## 2019-11-20 LAB — DIFFERENTIAL
Abs Immature Granulocytes: 0.02 10*3/uL (ref 0.00–0.07)
Basophils Absolute: 0.1 10*3/uL (ref 0.0–0.1)
Basophils Relative: 1 %
Eosinophils Absolute: 0.3 10*3/uL (ref 0.0–0.5)
Eosinophils Relative: 4 %
Immature Granulocytes: 0 %
Lymphocytes Relative: 26 %
Lymphs Abs: 1.8 10*3/uL (ref 0.7–4.0)
Monocytes Absolute: 0.5 10*3/uL (ref 0.1–1.0)
Monocytes Relative: 7 %
Neutro Abs: 4.3 10*3/uL (ref 1.7–7.7)
Neutrophils Relative %: 62 %

## 2019-11-20 LAB — APTT: aPTT: 26 seconds (ref 24–36)

## 2019-11-20 LAB — CBG MONITORING, ED: Glucose-Capillary: 80 mg/dL (ref 70–99)

## 2019-11-20 LAB — PROTIME-INR
INR: 1 (ref 0.8–1.2)
Prothrombin Time: 13 seconds (ref 11.4–15.2)

## 2019-11-20 LAB — ETHANOL: Alcohol, Ethyl (B): <10 mg/dL (ref <10)

## 2019-11-20 LAB — POCT FASTING CBG KUC MANUAL ENTRY: POCT Glucose (KUC): 89 mg/dL (ref 70–99)

## 2019-11-20 NOTE — Discharge Instructions (Addendum)
Take an 81 mg aspirin daily until followed up by your primary doctor.  Follow-up with your primary doctor in the office in the next 1 to 2 weeks for a recheck, and return to the ER if symptoms significantly worsen or change.

## 2019-11-20 NOTE — ED Provider Notes (Signed)
[facility] EMERGENCY DEPARTMENT Provider Note   CSN: [id number] Arrival date & time: 11/20/19  1152     History Chief Complaint  Patient presents with  . Code Stroke    Angela Salas is a 54 y.o. female.  Patient is a 54 year old female with history of hypertension.  She presents today for evaluation of blurry vision, difficulty speaking, and right arm tingling.  This began while she was at work at approximately 9:30 AM.  Her symptoms worsened and 911 was contacted.  She was transported here uneventfully.  Patient's symptoms are improving somewhat upon arrival.  The history is provided by the patient.  Weakness Severity:  Moderate Onset quality:  Sudden Timing:  Constant Progression:  Improving Relieved by:  Nothing Worsened by:  Nothing Ineffective treatments:  None tried      Past Medical History:  Diagnosis Date  . Bilateral groin pain 07/14/2015  . Complication of anesthesia   . Current use of estrogen therapy 10/17/2012  . Hypertension   . Kidney stone 09/2008  . Migraines   . PONV (postoperative nausea and vomiting)     Patient Active Problem List   Diagnosis Date Noted  . Anxiety as acute reaction to exceptional stress 04/02/2017  . Special screening for malignant neoplasms, colon   . Bilateral groin pain 07/14/2015  . Vasovagal syncope 04/26/2013  . Back pain with radiation 03/21/2013  . Pain in joint, lower leg 03/21/2013  . Hypertension 10/17/2012  . Migraines 10/17/2012  . Current use of estrogen therapy 10/17/2012    Past Surgical History:  Procedure Laterality Date  . ABDOMINAL HYSTERECTOMY    . COLONOSCOPY N/A 05/20/2016   Procedure: COLONOSCOPY;  Surgeon: West Bali, MD;  Location: [facility];  Service: Endoscopy;  Laterality: N/A;  11:30 AM - pt knows to arrive at 10:00  . lap lso       OB History    Gravida  2   Para  2   Term      Preterm      AB      Living  2     SAB      TAB      Ectopic      Multiple       Live Births  2           Family History  Problem Relation Age of Onset  . Diabetes Mother   . Hypertension Mother   . Depression Mother   . Other Mother        committed suicide  . Diabetes Maternal Grandmother     Social History   Tobacco Use  . Smoking status: Never Smoker  . Smokeless tobacco: Never Used  Substance Use Topics  . Alcohol use: No    Alcohol/week: 2.0 standard drinks    Types: 2 Glasses of wine per week    Comment: occ  . Drug use: No    Home Medications Prior to Admission medications   Medication Sig Start Date End Date Taking? Authorizing Provider  chlorzoxazone (PARAFON FORTE DSC) 500 MG tablet Take 1 tablet (500 mg total) by mouth 3 (three) times daily as needed for muscle spasms. 09/10/19   Babs [name], MD  estradiol (VIVELLE-DOT) 0.1 MG/24HR patch APPLY 1 PATCH EXTERNALLY TO THE SKIN 2 TIMES A WEEK AS DIRECTED 09/30/19   Adline Potter, NP  etodolac (LODINE) 400 MG tablet Take 1 tablet (400 mg total) by mouth 2 (two) times daily. 09/10/19  Babs [name], MD  hydrochlorothiazide (HYDRODIURIL) 25 MG tablet Take 1 tablet (25 mg total) by mouth every morning. 07/02/19   Babs [name], MD  IBU 800 MG tablet TAKE 1 TABLET BY MOUTH EVERY 8 HOURS AS NEEDED FOR PAIN 05/14/18   Cyril Mourning A, NP  potassium chloride (KLOR-CON) 10 MEQ tablet Take 1 tablet (10 mEq total) by mouth daily. 07/02/19   Babs [name], MD    Allergies    Anesthesia s-i-40 [propofol] and Lexapro [escitalopram]  Review of Systems   Review of Systems  Neurological: Positive for weakness.  All other systems reviewed and are negative.   Physical Exam Updated Vital Signs Ht 5\' 5"  (1.651 m)   Wt 63.5 kg   BMI 23.30 kg/m   Physical Exam Vitals and nursing note reviewed.  Constitutional:      General: She is not in acute distress.    Appearance: She is well-developed. She is not diaphoretic.  HENT:     Head: Normocephalic and atraumatic.  Eyes:     Extraocular  Movements: Extraocular movements intact.     Pupils: Pupils are equal, round, and reactive to light.     Comments: Patient describes blurry vision out of the left eye, but clear in the right.  There are no visual field cuts.  No nystagmus.  Cardiovascular:     Rate and Rhythm: Normal rate and regular rhythm.     Heart sounds: No murmur heard.  No friction rub. No gallop.   Pulmonary:     Effort: Pulmonary effort is normal. No respiratory distress.     Breath sounds: Normal breath sounds. No wheezing.  Abdominal:     General: Bowel sounds are normal. There is no distension.     Palpations: Abdomen is soft.     Tenderness: There is no abdominal tenderness.  Musculoskeletal:        General: Normal range of motion.     Cervical back: Normal range of motion and neck supple.  Skin:    General: Skin is warm and dry.  Neurological:     General: No focal deficit present.     Mental Status: She is alert and oriented to person, place, and time.     Cranial Nerves: No cranial nerve deficit.     Motor: No weakness.     Coordination: Coordination normal.     ED Results / Procedures / Treatments   Labs (all labs ordered are listed, but only abnormal results are displayed) Labs Reviewed  ETHANOL  PROTIME-INR  APTT  CBC  DIFFERENTIAL  COMPREHENSIVE METABOLIC PANEL  RAPID URINE DRUG SCREEN, HOSP PERFORMED  URINALYSIS, ROUTINE W REFLEX MICROSCOPIC  CBG MONITORING, ED  I-STAT CHEM 8, ED  POC URINE PREG, ED    EKG ED ECG REPORT   Date: 11/20/2019  Rate: 67  Rhythm: normal sinus rhythm  QRS Axis: normal  Intervals: normal  ST/T Wave abnormalities: normal  Conduction Disutrbances:none  Narrative Interpretation:   Old EKG Reviewed: none available  I have personally reviewed the EKG tracing and agree with the computerized printout as noted.   Radiology No results found.  Procedures Procedures (including critical care time)  Medications Ordered in ED Medications - No data to  display  ED Course  I have reviewed the triage vital signs and the nursing notes.  Pertinent labs & imaging results that were available during my care of the patient were reviewed by me and considered in my medical decision making (see  chart for details).    MDM Rules/Calculators/A&P  Patient is a 54 year old female presenting with complaints of headache, blurry vision, and facial numbness.  This started suddenly while at work.  Patient arrived here in a timely fashion and a code stroke was initiated.  Patient went for head CT which was unremarkable.  Laboratory studies also unremarkable.  She was seen by teleneurology who feels as though her symptoms could be explained by a complicated migraine.  They did also recommend an MRI to rule out stroke.  This was obtained and was negative.  Patient is now feeling much better.  At this point, I feel as though discharge is appropriate.  She is to follow-up with primary doctor.  Final Clinical Impression(s) / ED Diagnoses Final diagnoses:  None    Rx / DC Orders ED Discharge Orders    None       Geoffery Lyons, MD 11/20/19 1438

## 2019-11-20 NOTE — ED Triage Notes (Signed)
Pt reports was at work and at 0930 she noticed some blurred vision in r eye, mouth twitching, and aphasia.  Reports aphasia has improved but still having blurred vision.  Also reports r arm tingling.

## 2019-11-20 NOTE — Consult Note (Signed)
[facility] Tele[facility]rology Consult Services   Date of Service:   11/20/2019 12:10:43  Impression:       G43.3 - Complicated migraine  Comments/Sign-Out: 16 F, h/o migraine with visual aura, here with visual scotoma on the R, transient R numbness and expressive aphasia in the setting of migraine headache. Etiology consistent with complicated migraine. Head CT neg for acute abnl.   PLAN - MRI brain w/o contrast; if neg for ischemia, ok to dc home  --  Metrics: Last Known Well: 11/20/2019 09:30:00 [facility] Notification Time: 11/20/2019 12:10:42 Arrival Time: 11/20/2019 11:52:00 Stamp Time: 11/20/2019 12:10:43 Time First Login Attempt: 11/20/2019 12:16:02 Symptoms: headache, R blurred vision, transient aphasia. NIHSS Start Assessment Time: 11/20/2019 12:21:15 Patient is not a candidate for Thrombolytic. Thrombolytic Medical Decision: 11/20/2019 12:31:32 Patient was not deemed candidate for Thrombolytic because of following reasons: Other Diagnosis suspected.  CT head showed no acute hemorrhage or acute core infarct.  ED Physician notified of diagnostic impression and management plan on 11/20/2019 12:40:54  Advanced Imaging: Advanced Imaging Not Recommended because:  Clinical Presentation is not Suggestive of LVO and NIHSS is <6   Sign Out:       Discussed with Emergency Department Provider  ------------------------------------------------------------------------------  History of Present Illness: Patient is a 54 year old Female.  Patient was brought by private transportation with symptoms of headache, R blurred vision, transient aphasia.  52 F, h/o migraine with visual aura, who was LKW at 9:30 AM today when she developed an acute visual aura on the R side. She reported a shimmering blind spot in the R side of her vision which she affects both eyes. Pt then developed some numbness of her R arm as well as trouble speaking. She knew what she  wanted to say, but couldn't get any words out. This was followed by a dull throbbing headache, typical of her migraine. Came to ER for eval. NIHSS now 0. Has residual HA, but all other sx have resolved. Head CT neg for acute abnl.   Examination: 1A: Level of Consciousness - Alert; keenly responsive + 0 1B: Ask Month and Age - Both Questions Right + 0 1C: Blink Eyes & Squeeze Hands - Performs Both Tasks + 0 2: Test Horizontal Extraocular Movements - Normal + 0 3: Test Visual Fields - No Visual Loss + 0 4: Test Facial Palsy (Use Grimace if Obtunded) - Normal symmetry + 0 5A: Test Left Arm Motor Drift - No Drift for 10 Seconds + 0 5B: Test Right Arm Motor Drift - No Drift for 10 Seconds + 0 6A: Test Left Leg Motor Drift - No Drift for 5 Seconds + 0 6B: Test Right Leg Motor Drift - No Drift for 5 Seconds + 0 7: Test Limb Ataxia (FNF/Heel-Shin) - No Ataxia + 0 8: Test Sensation - Normal; No sensory loss + 0 9: Test Language/Aphasia - Normal; No aphasia + 0 10: Test Dysarthria - Normal + 0 11: Test Extinction/Inattention - No abnormality + 0  NIHSS Score: 0  Pre-Morbid Modified Rankin Scale: 0 Points = No symptoms at all   Patient/Family was informed the Neurology Consult would occur via TeleHealth consult by way of interactive audio and video telecommunications and consented to receiving care in this manner.   Patient is being evaluated for possible acute neurologic impairment and high probability of imminent or life-threatening deterioration. I spent total of 20 minutes providing care to this patient, including time for face to face visit via telemedicine, review of medical records,  imaging studies and discussion of findings with providers, the patient and/or family.   Dr Othelia Pulling   [facility] [phone]  Case [id number]

## 2019-11-20 NOTE — ED Notes (Signed)
cbg 80 

## 2019-11-20 NOTE — ED Triage Notes (Signed)
Pt presents to Urgent Care with complaints of right sided numbness, right sided visual disturbance and aphasia that started at 9am. Pt reports aphasia has improved. LSN 9 am. Pt meets code stroke criteria. Pt refused EMS transport to hospital and states her husband will drive her there. Pt is alert and oriented. CBG 89. BP 164/109. Rennis Harding PA made aware of patient. Report called to Charge RN Verlon Au at [facility].

## 2019-11-21 ENCOUNTER — Telehealth: Payer: Self-pay | Admitting: Family Medicine

## 2019-11-21 ENCOUNTER — Encounter: Payer: Self-pay | Admitting: Family Medicine

## 2019-11-21 NOTE — Telephone Encounter (Signed)
Patient was seen in ER yesterday for a real bad migraine patient is requesting something called in to migraines.  [facility]

## 2019-11-21 NOTE — Telephone Encounter (Signed)
Nurses Please try to get additional information.  I was able to review the ER record.  How is her symptoms currently?  How is the severity of the pain on a 1-10 scale.  Is it improving or getting worse?  What has she tried so far?  Has it seemed to help?  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

## 2019-11-22 ENCOUNTER — Other Ambulatory Visit: Payer: Self-pay

## 2019-11-22 ENCOUNTER — Encounter: Payer: Self-pay | Admitting: Family Medicine

## 2019-11-22 ENCOUNTER — Telehealth (INDEPENDENT_AMBULATORY_CARE_PROVIDER_SITE_OTHER): Payer: BC Managed Care – PPO | Admitting: Family Medicine

## 2019-11-22 DIAGNOSIS — G43109 Migraine with aura, not intractable, without status migrainosus: Secondary | ICD-10-CM | POA: Diagnosis not present

## 2019-11-22 DIAGNOSIS — E785 Hyperlipidemia, unspecified: Secondary | ICD-10-CM

## 2019-11-22 HISTORY — DX: Hyperlipidemia, unspecified: E78.5

## 2019-11-22 MED ORDER — POTASSIUM CHLORIDE ER 10 MEQ PO TBCR: 10.0000 meq | EXTENDED_RELEASE_TABLET | Freq: Every day | ORAL | 1 refills | Status: DC

## 2019-11-22 MED ORDER — HYDROCHLOROTHIAZIDE 25 MG PO TABS: 25.0000 mg | ORAL_TABLET | Freq: Every morning | ORAL | 1 refills | Status: DC

## 2019-11-22 MED ORDER — TOPIRAMATE 25 MG PO TABS
25.0000 mg | ORAL_TABLET | Freq: Two times a day (BID) | ORAL | 1 refills | Status: DC
Start: 2019-11-22 — End: 2020-10-09

## 2019-11-22 NOTE — Progress Notes (Addendum)
   Subjective:    Patient ID: Angela Salas, female    DOB: 09-Dec-1965, 54 y.o.   MRN: [id number] .tel Virtual Visit via Video Note  I connected with Angela Salas on 12/30/19 at  9:20 AM EDT by a video enabled telemedicine application and verified that I am speaking with the correct person using two identifiers.  Location: Patient: Workplace  Provider: Office   I discussed the limitations of evaluation and management by telemedicine and the availability of in person appointments. The patient expressed understanding and agreed to proceed.  History of Present Illness:    Observations/Objective:   Assessment and Plan:   Follow Up Instructions:    I discussed the assessment and treatment plan with the patient. The patient was provided an opportunity to ask questions and all were answered. The patient agreed with the plan and demonstrated an understanding of the instructions.   The patient was advised to call back or seek an in-person evaluation if the symptoms worsen or if the condition fails to improve as anticipated.  I provided 20 minutes of non-face-to-face time during this encounter.   Lilyan Punt, MD   HPI Pt is having migraines and had ER visit on 11/20/19 for facial numbness. Pt states the ER was concerned she had a stroke. Pt is also having migraines and would like to try medication to help prevent them.   Please see ER notes I reviewed over the EKG minimal change I do not recommend any type of further testing in that regards She is having frequent headaches per week so therefore we did discuss topiramate.  We also discussed relooking at cholesterol profile Review of Systems  Constitutional: Negative for activity change, appetite change and fatigue.  HENT: Negative for congestion and rhinorrhea.   Respiratory: Negative for cough and shortness of breath.   Cardiovascular: Negative for chest pain and leg swelling.  Gastrointestinal: Negative for abdominal pain  and diarrhea.  Endocrine: Negative for polydipsia and polyphagia.  Skin: Negative for color change.  Neurological: Positive for light-headedness and numbness. Negative for dizziness and weakness.  Psychiatric/Behavioral: Negative for behavioral problems and confusion.       Objective:    20 minutes was spent with patient today discussing healthcare issues which they came.  More than 50% of this visit-total duration of visit-was spent in counseling and coordination of care.  Please see diagnosis regarding the focus of this coordination and care Patient had virtual visit Appears to be in no distress Atraumatic Neuro able to relate and oriented No apparent resp distress Color normal       Assessment & Plan:  Complex migraines Discussed potential side effects of Topamax Patient gets at least 3 to 4/week Start Topamax 25 mg twice daily Patient to give [place] a MyChart update within 3 to 4 weeks In person office visit for blood pressure hyperlipidemia and complex migraines in 2 months

## 2019-11-22 NOTE — Addendum Note (Signed)
Addended by: Metro Kung on: 11/22/2019 10:52 AM   Modules accepted: Orders

## 2019-11-22 NOTE — Telephone Encounter (Signed)
Pt scheduled for phone visit at 9:20 am with provider.

## 2019-11-22 NOTE — Progress Notes (Signed)
Lipid put in for pt to do at [facility]

## 2019-12-11 LAB — LIPID PANEL
Chol/HDL Ratio: 4 ratio (ref 0.0–4.4)
Cholesterol, Total: 206 mg/dL — ABNORMAL HIGH (ref 100–199)
HDL: 51 mg/dL (ref >39)
LDL Chol Calc (NIH): 141 mg/dL — ABNORMAL HIGH (ref 0–99)
Triglycerides: 80 mg/dL (ref 0–149)
VLDL Cholesterol Cal: 14 mg/dL (ref 5–40)

## 2020-01-20 ENCOUNTER — Other Ambulatory Visit: Payer: Self-pay | Admitting: Obstetrics & Gynecology

## 2020-01-20 DIAGNOSIS — Z1231 Encounter for screening mammogram for malignant neoplasm of breast: Secondary | ICD-10-CM

## 2020-01-27 ENCOUNTER — Ambulatory Visit (INDEPENDENT_AMBULATORY_CARE_PROVIDER_SITE_OTHER): Payer: BC Managed Care – PPO | Admitting: Family Medicine

## 2020-01-27 ENCOUNTER — Encounter: Payer: Self-pay | Admitting: Family Medicine

## 2020-01-27 ENCOUNTER — Telehealth: Payer: Self-pay

## 2020-01-27 VITALS — BP 122/74 | HR 96 | Temp 97.7°F

## 2020-01-27 DIAGNOSIS — Z23 Encounter for immunization: Secondary | ICD-10-CM

## 2020-01-27 DIAGNOSIS — R109 Unspecified abdominal pain: Secondary | ICD-10-CM

## 2020-01-27 DIAGNOSIS — I10 Essential (primary) hypertension: Secondary | ICD-10-CM | POA: Diagnosis not present

## 2020-01-27 DIAGNOSIS — G43109 Migraine with aura, not intractable, without status migrainosus: Secondary | ICD-10-CM | POA: Diagnosis not present

## 2020-01-27 LAB — POCT URINALYSIS DIPSTICK
Protein, UA: POSITIVE — AB
Spec Grav, UA: >=1.03 — AB (ref 1.010–1.025)
pH, UA: 5 (ref 5.0–8.0)

## 2020-01-27 MED ORDER — CIPROFLOXACIN HCL 500 MG PO TABS
500.0000 mg | ORAL_TABLET | Freq: Two times a day (BID) | ORAL | 0 refills | Status: DC
Start: 2020-01-27 — End: 2020-01-29

## 2020-01-27 MED ORDER — HYDROCODONE-ACETAMINOPHEN 5-325 MG PO TABS: 1.0000 | ORAL_TABLET | ORAL | 0 refills | Status: DC | PRN

## 2020-01-27 MED ORDER — METRONIDAZOLE 500 MG PO TABS
500.0000 mg | ORAL_TABLET | Freq: Three times a day (TID) | ORAL | 0 refills | Status: DC
Start: 2020-01-27 — End: 2020-10-09

## 2020-01-27 MED ORDER — ONDANSETRON HCL 8 MG PO TABS
8.0000 mg | ORAL_TABLET | Freq: Three times a day (TID) | ORAL | 0 refills | Status: DC | PRN
Start: 2020-01-27 — End: 2020-10-09

## 2020-01-27 NOTE — Telephone Encounter (Signed)
Thursday morning at 820 she would need to notify us if this is not going to work for her obviously if she is getting worse with her abdominal pain over the next 48 hours she needs to be seen sooner

## 2020-01-27 NOTE — Telephone Encounter (Signed)
Patient forgot she had appointment Wednesday morning at 9 am and cant come to 8:20 . She there any other day or time she can be worked in. Please advise

## 2020-01-27 NOTE — Addendum Note (Signed)
Addended by: Metro Kung on: 01/27/2020 03:30 PM   Modules accepted: Orders

## 2020-01-27 NOTE — Progress Notes (Signed)
   Subjective:    Patient ID: Angela Salas, female    DOB: [birth year], 54 y.o.   MRN: [id number]  HPIFollow up on migraines and bp. Pt states migraines are much better. Needs refill on topamax.  Migraines doing better under good control.  Feels like she is doing well with her blood pressure as well minimizes salt diet stays physically active.  Pt states she  Has been having low abdominal pain for 2 days. Having more frequent urination.  Denies high fever chills sweats wheezing difficulty breathing.  Relates lower abdominal pain has had hysterectomy total.  Had colonoscopy 3 years ago which showed diverticula.    Review of Systems  Constitutional: Negative for activity change, appetite change and fatigue.  HENT: Negative for congestion and rhinorrhea.   Respiratory: Negative for cough.   Cardiovascular: Negative for chest pain and leg swelling.  Gastrointestinal: Positive for abdominal pain. Negative for constipation and diarrhea.  Genitourinary: Positive for frequency and pelvic pain. Negative for decreased urine volume and dysuria.  Skin: Negative for color change.  Neurological: Negative for headaches.  Psychiatric/Behavioral: Negative for behavioral problems.       Objective:   Physical Exam Lungs clear respiratory rate normal heart regular no murmurs flanks nontender lower abdominal region moderate tenderness in the mid lower abdomen and the left side to some degree the right side no guarding or rebound More than likely diverticulitis less likely appendicitis       Assessment & Plan:  Abdominal pain Discussed options with the patient Patient with tenderness but no rebound. Would be reasonable to do antibiotics to cover for diverticulitis with follow-up in 48 hours.  If the patient should get worse over the next 24 to 48 hours urgent CT scan of abdomen pelvis. We did discuss going ahead with CT at this time but patient has opted for the above process with follow-up in 48  hours.  Blood pressure stable continue current measures  Migraines stable continue current measures  Patient understands if she gets worse to follow-up immediately

## 2020-01-29 ENCOUNTER — Other Ambulatory Visit: Payer: Self-pay | Admitting: *Deleted

## 2020-01-29 ENCOUNTER — Ambulatory Visit: Payer: BC Managed Care – PPO | Admitting: Family Medicine

## 2020-01-29 ENCOUNTER — Encounter: Payer: Self-pay | Admitting: Family Medicine

## 2020-01-29 MED ORDER — AMOXICILLIN-POT CLAVULANATE 875-125 MG PO TABS
1.0000 | ORAL_TABLET | Freq: Two times a day (BID) | ORAL | 0 refills | Status: DC
Start: 2020-01-29 — End: 2020-10-09

## 2020-01-29 NOTE — Telephone Encounter (Signed)
Nurses Have any discontinue Cipro and Flagyl  Start Augmentin 875 mg 1 twice daily for 10 days Please take with a snack to lessen the chance of upset stomach Follow-up on Thursday as planned

## 2020-01-30 ENCOUNTER — Other Ambulatory Visit: Payer: Self-pay

## 2020-01-30 ENCOUNTER — Ambulatory Visit (INDEPENDENT_AMBULATORY_CARE_PROVIDER_SITE_OTHER): Payer: BC Managed Care – PPO | Admitting: Family Medicine

## 2020-01-30 VITALS — Temp 97.0°F

## 2020-01-30 DIAGNOSIS — K5792 Diverticulitis of intestine, part unspecified, without perforation or abscess without bleeding: Secondary | ICD-10-CM

## 2020-01-30 LAB — URINE CULTURE

## 2020-01-30 NOTE — Progress Notes (Signed)
   Subjective:    Patient ID: Angela Salas, female    DOB: 09/17/1965, 54 y.o.   MRN: [id number]  HPIrecheck abdomen.   Patient here today for recheck of what was felt to be diverticulitis.  She states abdominal discomfort is improving some.  She was not able to tolerate Cipro or Flagyl because vomiting she is tolerating the Augmentin twice daily.  Denies any fevers chills.  Review of Systems See above    Objective:   Physical Exam Flanks nontender to percussion Abdomen soft lower abdominal region no guarding or rebound.  Tender in the mid lower abdomen left lower abdomen.       Assessment & Plan:  Abdominal pain Felt to be diverticulitis Need to recheck her again in several days If not showing dramatic improvement CT scan is next step.  Hold off on lab work CT scan If severe pain or worsening problems to follow-up immediately

## 2020-01-31 ENCOUNTER — Encounter: Payer: Self-pay | Admitting: Family Medicine

## 2020-02-03 ENCOUNTER — Ambulatory Visit: Payer: BC Managed Care – PPO | Admitting: Family Medicine

## 2020-02-03 ENCOUNTER — Other Ambulatory Visit: Payer: Self-pay

## 2020-02-03 DIAGNOSIS — K5792 Diverticulitis of intestine, part unspecified, without perforation or abscess without bleeding: Secondary | ICD-10-CM

## 2020-02-03 DIAGNOSIS — R109 Unspecified abdominal pain: Secondary | ICD-10-CM

## 2020-02-03 NOTE — Progress Notes (Signed)
   Subjective:    Patient ID: Angela Salas, female    DOB: [birth year], 54 y.o.   MRN: [id number]  HPI  Patient states she is much better from where she was lower abdomen feels much better.  Minimal soreness.  No nausea or vomiting.  Little bit of diarrhea but nothing severe  Review of Systems     Objective:   Physical Exam Flanks nontender abdomen soft no guarding rebound or tenderness       Assessment & Plan:  Probable diverticulitis Now that her abdominal discomfort is significantly better there is absolutely no need to do any type of scan or lab work finish out the antibiotics warning signs were discussed follow-up if ongoing troubles

## 2020-02-06 ENCOUNTER — Ambulatory Visit: Payer: BC Managed Care – PPO | Admitting: Family Medicine

## 2020-03-09 ENCOUNTER — Ambulatory Visit: Payer: BC Managed Care – PPO

## 2020-03-13 ENCOUNTER — Ambulatory Visit
Admission: EM | Admit: 2020-03-13 | Discharge: 2020-03-13 | Disposition: A | Discharge status: Home / Self Care | Payer: BC Managed Care – PPO | Attending: Emergency Medicine | Admitting: Emergency Medicine

## 2020-03-13 ENCOUNTER — Other Ambulatory Visit: Payer: Self-pay

## 2020-03-13 DIAGNOSIS — Z1152 Encounter for screening for COVID-19: Secondary | ICD-10-CM | POA: Diagnosis not present

## 2020-03-13 DIAGNOSIS — J069 Acute upper respiratory infection, unspecified: Secondary | ICD-10-CM | POA: Diagnosis not present

## 2020-03-13 MED ORDER — CETIRIZINE HCL 10 MG PO TABS: 10.0000 mg | ORAL_TABLET | Freq: Every day | ORAL | 0 refills | Status: DC

## 2020-03-13 MED ORDER — BENZONATATE 100 MG PO CAPS
100.0000 mg | ORAL_CAPSULE | Freq: Three times a day (TID) | ORAL | 0 refills | Status: DC
Start: 2020-03-13 — End: 2020-10-13

## 2020-03-13 MED ORDER — DEXAMETHASONE 4 MG PO TABS: 4.0000 mg | ORAL_TABLET | Freq: Every day | ORAL | 0 refills | Status: AC

## 2020-03-13 MED ORDER — FLUTICASONE PROPIONATE 50 MCG/ACT NA SUSP
1.0000 | Freq: Every day | NASAL | 0 refills | Status: DC
Start: 2020-03-13 — End: 2020-10-09

## 2020-03-13 NOTE — ED Provider Notes (Signed)
[facility]   [id number] 03/13/20 Arrival Time: 0856   CC: COVID symptoms  SUBJECTIVE: History from: patient.  Angela Salas is a 54 y.o. female who presented to the urgent care with a complaint of cough, body aches, headache and fever at morning 1 F at home..  Reports sick exposure to Covid positive person and work.  Has tried OTC Alka-Seltzer cold with without relief.  Denies aggravating factors.  Denies previous symptoms in the past.   Denies fever, chills, fatigue, sinus pain, rhinorrhea, sore throat, SOB, wheezing, chest pain, nausea, changes in bowel or bladder habits.     ROS: As per HPI.  All other pertinent ROS negative.      Past Medical History:  Diagnosis Date   Bilateral groin pain 07/14/2015   Complication of anesthesia    Current use of estrogen therapy 10/17/2012   Hyperlipidemia 11/22/2019   Hypertension    Kidney stone 09/2008   Migraines    PONV (postoperative nausea and vomiting)    Past Surgical History:  Procedure Laterality Date   ABDOMINAL HYSTERECTOMY     COLONOSCOPY N/A 05/20/2016   Procedure: COLONOSCOPY;  Surgeon: West Bali, MD;  Location: [facility];  Service: Endoscopy;  Laterality: N/A;  11:30 AM - pt knows to arrive at 10:00   lap lso     Allergies  Allergen Reactions   Anesthesia S-I-40 [Propofol]     Complications of anesthesia - reported by pt   Lexapro [Escitalopram] Palpitations    Made her feel "weird"    No current facility-administered medications on file prior to encounter.   Current Outpatient Medications on File Prior to Encounter  Medication Sig Dispense Refill   hydrochlorothiazide (HYDRODIURIL) 25 MG tablet Take 1 tablet (25 mg total) by mouth every morning. 90 tablet 1   IBU 800 MG tablet TAKE 1 TABLET BY MOUTH EVERY 8 HOURS AS NEEDED FOR PAIN (Patient taking differently: Take 800 mg by mouth 3 (three) times daily. ) 60 tablet 3   potassium chloride (KLOR-CON) 10 MEQ tablet Take 1 tablet (10  mEq total) by mouth daily. 90 tablet 1   amoxicillin-clavulanate (AUGMENTIN) 875-125 MG tablet Take 1 tablet by mouth 2 (two) times daily. 20 tablet 0   estradiol (VIVELLE-DOT) 0.1 MG/24HR patch APPLY 1 PATCH EXTERNALLY TO THE SKIN 2 TIMES A WEEK AS DIRECTED 8 patch 12   HYDROcodone-acetaminophen (NORCO/VICODIN) 5-325 MG tablet Take 1 tablet by mouth every 4 (four) hours as needed for moderate pain. 20 tablet 0   metroNIDAZOLE (FLAGYL) 500 MG tablet Take 1 tablet (500 mg total) by mouth 3 (three) times daily. 21 tablet 0   ondansetron (ZOFRAN) 8 MG tablet Take 1 tablet (8 mg total) by mouth every 8 (eight) hours as needed for nausea or vomiting. 15 tablet 0   topiramate (TOPAMAX) 25 MG tablet Take 1 tablet (25 mg total) by mouth 2 (two) times daily. 180 tablet 1   Social History   Socioeconomic History   Marital status: Married    Spouse name: Not on file   Number of children: Not on file   Years of education: Not on file   Highest education level: Not on file  Occupational History   Not on file  Tobacco Use   Smoking status: Never Smoker   Smokeless tobacco: Never Used  Vaping Use   Vaping Use: Never used  Substance and Sexual Activity   Alcohol use: No    Alcohol/week: 2.0 standard drinks  Types: 2 Glasses of wine per week    Comment: occ   Drug use: No   Sexual activity: Yes    Birth control/protection: Surgical    Comment: hyst  Other Topics Concern   Not on file  Social History Narrative   Not on file   Social Determinants of Health   Financial Resource Strain:    Difficulty of Paying Living Expenses: Not on file  Food Insecurity:    Worried About Programme researcher, broadcasting/film/video in the Last Year: Not on file   [employer] of Food in the Last Year: Not on file  Transportation Needs:    Lack of Transportation (Medical): Not on file   Lack of Transportation (Non-Medical): Not on file  Physical Activity:    Days of Exercise per Week: Not on file   Minutes  of Exercise per Session: Not on file  Stress:    Feeling of Stress : Not on file  Social Connections:    Frequency of Communication with Friends and Family: Not on file   Frequency of Social Gatherings with Friends and Family: Not on file   Attends Religious Services: Not on file   Active Member of Clubs or Organizations: Not on file   Attends Banker Meetings: Not on file   Marital Status: Not on file  Intimate Partner Violence:    Fear of Current or Ex-Partner: Not on file   Emotionally Abused: Not on file   Physically Abused: Not on file   Sexually Abused: Not on file   Family History  Problem Relation Age of Onset   Diabetes Mother    Hypertension Mother    Depression Mother    Other Mother        committed suicide   Diabetes Maternal Grandmother     OBJECTIVE:  Vitals:   03/13/20 0922  BP: 131/87  Pulse: (!) 109  Resp: 18  Temp: 100.1 F (37.8 C)  TempSrc: Oral  SpO2: 96%     General appearance: alert; appears fatigued, but nontoxic; speaking in full sentences and tolerating own secretions HEENT: NCAT; Ears: EACs clear, TMs pearly gray; Eyes: PERRL.  EOM grossly intact. Sinuses: nontender; Nose: nares patent without rhinorrhea, Throat: oropharynx clear, tonsils non erythematous or enlarged, uvula midline  Neck: supple without LAD Lungs: unlabored respirations, symmetrical air entry; cough: moderate; no respiratory distress; CTAB Heart: regular rate and rhythm.  Radial pulses 2+ symmetrical bilaterally Skin: warm and dry Psychological: alert and cooperative; normal mood and affect  LABS:  No results found for this or any previous visit (from the past 24 hour(s)).   ASSESSMENT & PLAN:  1. URI with cough and congestion   2. Encounter for screening for COVID-19     Meds ordered this encounter  Medications   benzonatate (TESSALON) 100 MG capsule    Sig: Take 1 capsule (100 mg total) by mouth every 8 (eight) hours.    Dispense:   30 capsule    Refill:  0   fluticasone (FLONASE) 50 MCG/ACT nasal spray    Sig: Place 1 spray into both nostrils daily for 14 days.    Dispense:  16 g    Refill:  0   cetirizine (ZYRTEC ALLERGY) 10 MG tablet    Sig: Take 1 tablet (10 mg total) by mouth daily.    Dispense:  30 tablet    Refill:  0   dexamethasone (DECADRON) 4 MG tablet    Sig: Take 1 tablet (4 mg  total) by mouth daily for 7 days.    Dispense:  7 tablet    Refill:  0    Discharge instructions    COVID testing ordered.  It will take between 2-7 days for test results.  Someone will contact you regarding abnormal results.    In the meantime: You should remain isolated in your home for 10 days from symptom onset AND greater than 24 hours after symptoms resolution (absence of fever without the use of fever-reducing medication and improvement in respiratory symptoms), whichever is longer Get plenty of rest and push fluids Tessalon Perles prescribed for cough zyrtec for nasal congestion, runny nose, and/or sore throat Flonase for nasal congestion and runny nose Decadron was prescribed Use medications daily for symptom relief Use OTC medications like ibuprofen or tylenol as needed fever or pain Call or go to the ED if you have any new or worsening symptoms such as fever, worsening cough, shortness of breath, chest tightness, chest pain, turning blue, changes in mental status, etc...   Reviewed expectations re: course of current medical issues. Questions answered. Outlined signs and symptoms indicating need for more acute intervention. Patient verbalized understanding. After Visit Summary given.         Durward Parcel, FNP 03/13/20 [phone]

## 2020-03-13 NOTE — ED Triage Notes (Signed)
Pt reports cough, body aches, fever starting yesterday.  Fever at home around 101.  Has been taking Tylenol and AlkaSeltzer Cold.   Potentially exposed to COVID in last week.    Has had Moderna x 2 with second dose being in April.

## 2020-03-13 NOTE — Discharge Instructions (Addendum)
COVID testing ordered.  It will take between 2-7 days for test results.  Someone will contact you regarding abnormal results.    In the meantime: You should remain isolated in your home for 10 days from symptom onset AND greater than 24 hours after symptoms resolution (absence of fever without the use of fever-reducing medication and improvement in respiratory symptoms), whichever is longer Get plenty of rest and push fluids Tessalon Perles prescribed for cough zyrtec for nasal congestion, runny nose, and/or sore throat Flonase for nasal congestion and runny nose Decadron was prescribed Use medications daily for symptom relief Use OTC medications like ibuprofen or tylenol as needed fever or pain Call or go to the ED if you have any new or worsening symptoms such as fever, worsening cough, shortness of breath, chest tightness, chest pain, turning blue, changes in mental status, etc..Marland Kitchen

## 2020-03-14 LAB — COVID-19, FLU A+B AND RSV
Influenza A, NAA: NOT DETECTED
Influenza B, NAA: NOT DETECTED
RSV, NAA: NOT DETECTED
SARS-CoV-2, NAA: DETECTED — AB

## 2020-04-20 ENCOUNTER — Ambulatory Visit: Payer: BC Managed Care – PPO

## 2020-05-13 ENCOUNTER — Ambulatory Visit
Admission: RE | Admit: 2020-05-13 | Discharge: 2020-05-13 | Disposition: A | Discharge status: Home / Self Care | Payer: BC Managed Care – PPO | Source: Ambulatory Visit | Attending: Obstetrics & Gynecology | Admitting: Obstetrics & Gynecology

## 2020-05-13 ENCOUNTER — Other Ambulatory Visit: Payer: Self-pay

## 2020-05-13 DIAGNOSIS — Z1231 Encounter for screening mammogram for malignant neoplasm of breast: Secondary | ICD-10-CM

## 2020-07-23 ENCOUNTER — Other Ambulatory Visit: Payer: Self-pay | Admitting: Family Medicine

## 2020-07-24 NOTE — Telephone Encounter (Signed)
90-day with no refills Needs follow-up visit in the summer office visit labs

## 2020-09-08 ENCOUNTER — Telehealth: Payer: Self-pay

## 2020-09-08 NOTE — Telephone Encounter (Signed)
Wants to see if refill on antifungal cream can be refilled for her nose? Zovirax I don't see on med list?   Pt call back [phone]

## 2020-09-09 ENCOUNTER — Other Ambulatory Visit: Payer: Self-pay | Admitting: Adult Health

## 2020-09-10 ENCOUNTER — Other Ambulatory Visit: Payer: Self-pay

## 2020-09-10 DIAGNOSIS — B001 Herpesviral vesicular dermatitis: Secondary | ICD-10-CM

## 2020-09-10 MED ORDER — VALACYCLOVIR HCL 1 G PO TABS: ORAL_TABLET | ORAL | 2 refills | Status: DC

## 2020-09-10 NOTE — Telephone Encounter (Signed)
So zivirax is a antiviral cream for cold sores.  Please try to clarify with the patient what she is trying to get refilled.  If she can spell this to the nurse or at least nurses check with pharmacy to be certain we are refilling what she is requesting

## 2020-09-10 NOTE — Telephone Encounter (Signed)
Patient has been informed per drs notes. 

## 2020-09-10 NOTE — Telephone Encounter (Signed)
Patient has a cold sore under her nose and needs sent in to [facility]- has left for daughter's wedding

## 2020-09-10 NOTE — Telephone Encounter (Signed)
So in her situation Valtrex 1 g would work best. Take 2 pills now and repeat 2 pills in 12 hours #4 with 2 refills Studies show that this works better than topical There is no need to do topical This regimen has the best prospect of clearing things up Finally a refill has been placed with that should she get another 1 of these in the future Let us know if any problems

## 2020-10-09 ENCOUNTER — Telehealth: Payer: Self-pay

## 2020-10-09 ENCOUNTER — Ambulatory Visit: Payer: BC Managed Care – PPO | Admitting: Family Medicine

## 2020-10-09 ENCOUNTER — Other Ambulatory Visit: Payer: Self-pay

## 2020-10-09 ENCOUNTER — Encounter: Payer: Self-pay | Admitting: Family Medicine

## 2020-10-09 ENCOUNTER — Ambulatory Visit (HOSPITAL_COMMUNITY)
Admission: RE | Admit: 2020-10-09 | Discharge: 2020-10-09 | Disposition: A | Discharge status: Home / Self Care | Payer: BC Managed Care – PPO | Source: Ambulatory Visit | Attending: Family Medicine | Admitting: Family Medicine

## 2020-10-09 VITALS — BP 129/80 | HR 76 | Temp 97.3°F | Ht 65.0 in | Wt 145.0 lb

## 2020-10-09 DIAGNOSIS — N898 Other specified noninflammatory disorders of vagina: Secondary | ICD-10-CM | POA: Insufficient documentation

## 2020-10-09 DIAGNOSIS — R109 Unspecified abdominal pain: Secondary | ICD-10-CM

## 2020-10-09 DIAGNOSIS — G43109 Migraine with aura, not intractable, without status migrainosus: Secondary | ICD-10-CM

## 2020-10-09 DIAGNOSIS — Z124 Encounter for screening for malignant neoplasm of cervix: Secondary | ICD-10-CM

## 2020-10-09 LAB — POCT URINALYSIS DIPSTICK
Spec Grav, UA: 1.015 (ref 1.010–1.025)
pH, UA: 6.5 (ref 5.0–8.0)

## 2020-10-09 MED ORDER — IBUPROFEN 800 MG PO TABS: 800.0000 mg | ORAL_TABLET | Freq: Three times a day (TID) | ORAL | 0 refills | Status: DC | PRN

## 2020-10-09 MED ORDER — ALPRAZOLAM 0.5 MG PO TABS
0.5000 mg | ORAL_TABLET | Freq: Two times a day (BID) | ORAL | 0 refills | Status: DC | PRN
Start: 2020-10-09 — End: 2021-02-06

## 2020-10-09 MED ORDER — TOPIRAMATE 25 MG PO TABS: 25.0000 mg | ORAL_TABLET | Freq: Two times a day (BID) | ORAL | 0 refills | Status: DC

## 2020-10-09 NOTE — Progress Notes (Signed)
Patient ID: Angela Salas, female    DOB: 1966-02-24, 55 y.o.   MRN: [id number]   Chief Complaint  Patient presents with   Vaginal Prolapse    Subjective:    HPI Pt seen for concern of vaginal mass/protrusion. Also requesting refill on topamax and ibuprofen 800mg . Takes for headaches.   Pt noticed something protuding out of her vagina last night when she wiped last night bm. Lower abdominal pain for past 3 -4 days.   Feeling something proturding out vaginal after having small bm. Golf sided mass.  Vaginal deliveries 2x.  H/o hysterectomy. No issues with urination.  No discharge or blood,  No pain with intercourse.  Dr. , gyn- giving estadiol patches for years.  Ovaries have been removed separately one in 2003 and in 2006 Been on estradiol patch since 2003. No blood or black stools.  No n/v/d or abd pain.  But later stating intermittent rt lower quad pain.  Results for orders placed or performed in visit on 10/09/20  POCT urinalysis dipstick  Result Value Ref Range   Color, UA     Clarity, UA     Glucose, UA     Bilirubin, UA     Ketones, UA trace    Spec Grav, UA 1.015 1.010 - 1.025   Blood, UA     pH, UA 6.5 5.0 - 8.0   Protein, UA     Urobilinogen, UA     Nitrite, UA     Leukocytes, UA     Appearance     Odor        Medical History Angela Salas has a past medical history of Bilateral groin pain (07/14/2015), Complication of anesthesia, Current use of estrogen therapy (10/17/2012), Hyperlipidemia (11/22/2019), Hypertension, Kidney stone (09/2008), Migraines, and PONV (postoperative nausea and vomiting).   Outpatient Encounter Medications as of 10/09/2020  Medication Sig   ALPRAZolam (XANAX) 0.5 MG tablet Take 1 tablet (0.5 mg total) by mouth 2 (two) times daily as needed for anxiety.   benzonatate (TESSALON) 100 MG capsule Take 1 capsule (100 mg total) by mouth every 8 (eight) hours.   estradiol (VIVELLE-DOT) 0.1 MG/24HR patch APPLY 1 PATCH EXTERNALLY TO THE SKIN  2 TIMES A WEEK AS DIRECTED   hydrochlorothiazide (HYDRODIURIL) 25 MG tablet TAKE 1 TABLET(25 MG) BY MOUTH EVERY MORNING   HYDROcodone-acetaminophen (NORCO/VICODIN) 5-325 MG tablet Take 1 tablet by mouth every 4 (four) hours as needed for moderate pain.   potassium chloride (KLOR-CON) 10 MEQ tablet TAKE 1 TABLET(10 MEQ) BY MOUTH DAILY   [DISCONTINUED] IBU 800 MG tablet TAKE 1 TABLET BY MOUTH EVERY 8 HOURS AS NEEDED FOR PAIN (Patient taking differently: Take 800 mg by mouth 3 (three) times daily.)   [DISCONTINUED] metroNIDAZOLE (FLAGYL) 500 MG tablet Take 1 tablet (500 mg total) by mouth 3 (three) times daily.   [DISCONTINUED] topiramate (TOPAMAX) 25 MG tablet Take 1 tablet (25 mg total) by mouth 2 (two) times daily.   ibuprofen (IBU) 800 MG tablet Take 1 tablet (800 mg total) by mouth every 8 (eight) hours as needed. for pain   topiramate (TOPAMAX) 25 MG tablet Take 1 tablet (25 mg total) by mouth 2 (two) times daily.   [DISCONTINUED] amoxicillin-clavulanate (AUGMENTIN) 875-125 MG tablet Take 1 tablet by mouth 2 (two) times daily.   [DISCONTINUED] cetirizine (ZYRTEC ALLERGY) 10 MG tablet Take 1 tablet (10 mg total) by mouth daily.   [DISCONTINUED] fluticasone (FLONASE) 50 MCG/ACT nasal spray Place 1 spray into both nostrils daily  for 14 days.   [DISCONTINUED] ondansetron (ZOFRAN) 8 MG tablet Take 1 tablet (8 mg total) by mouth every 8 (eight) hours as needed for nausea or vomiting.   [DISCONTINUED] valACYclovir (VALTREX) 1000 MG tablet TAKE 2 TABS NOW AND 2 TABS IN 12 HRS   No facility-administered encounter medications on file as of 10/09/2020.     Review of Systems  Constitutional:  Negative for chills and fever.  HENT:  Negative for congestion, rhinorrhea and sore throat.   Respiratory:  Negative for cough, shortness of breath and wheezing.   Cardiovascular:  Negative for chest pain and leg swelling.  Gastrointestinal:  Negative for abdominal pain, diarrhea, nausea and vomiting.   Genitourinary:  Negative for dysuria, frequency, hematuria, pelvic pain, urgency, vaginal bleeding, vaginal discharge and vaginal pain.       +vaginal mass/protrusion  Musculoskeletal:  Negative for arthralgias and back pain.  Skin:  Negative for rash.  Neurological:  Negative for dizziness, weakness and headaches.    Vitals BP 129/80   Pulse 76   Temp (!) 97.3 F (36.3 C)   Ht 5\' 5"  (1.651 m)   Wt 145 lb (65.8 kg)   SpO2 98%   BMI 24.13 kg/m   Objective:   Physical Exam Vitals and nursing note reviewed.  Constitutional:      General: She is in acute distress.     Appearance: Normal appearance.  HENT:     Head: Normocephalic and atraumatic.  Eyes:     Extraocular Movements: Extraocular movements intact.     Conjunctiva/sclera: Conjunctivae normal.     Pupils: Pupils are equal, round, and reactive to light.  Pulmonary:     Effort: Pulmonary effort is normal.     Breath sounds: Normal breath sounds.  Abdominal:     General: Abdomen is flat. There is no distension.     Palpations: Abdomen is soft. There is no mass.     Tenderness: There is no abdominal tenderness. There is no guarding or rebound.     Hernia: No hernia is present.  Genitourinary:    General: Normal vulva.     Vagina: No vaginal discharge.     Rectum: Normal.     Comments: +vaginal mass-irregular shape and texture, no bleeding, sores, or discharge.  At the vaginal introitus.  On palpation- is irregular and about 1-2 inch.  On speculum exam- white discharge, cervix was surgically removed. Musculoskeletal:        General: Normal range of motion.     Right lower leg: No edema.     Left lower leg: No edema.  Skin:    General: Skin is warm and dry.     Findings: No lesion or rash.  Neurological:     General: No focal deficit present.     Mental Status: She is alert and oriented to person, place, and time.     Cranial Nerves: No cranial nerve deficit.  Psychiatric:        Behavior: Behavior normal.         Thought Content: Thought content normal.        Judgment: Judgment normal.     Comments: +anxious    Assessment and Plan   1. Abdominal pain, unspecified abdominal location - POCT urinalysis dipstick - Pelvic Complete With Transvaginal  2. Vaginal mass - Ambulatory referral to Gynecology - US Pelvic Complete With Transvaginal  3. Migraine with aura and without status migrainosus, not intractable - topiramate (TOPAMAX) 25 MG tablet; Take  1 tablet (25 mg total) by mouth 2 (two) times daily.  Dispense: 60 tablet; Refill: 0 - ibuprofen (IBU) 800 MG tablet; Take 1 tablet (800 mg total) by mouth every 8 (eight) hours as needed. for pain  Dispense: 60 tablet; Refill: 0   Vaginal prolapse/vaginal mass- reducible, no pain or bleeding. Non tender.  Tried to get cells with a pap smear brush for analysis.  Called to gyn to get urgent consult of vaginal mass.  Will try to get pt in early next week. Ordered stat u/s transvaginal. Pt very anxious and will give small course of medication to help through the weekend till she can get in for her appt.  Reviewed case with Dr. Lorin Picket and in agreement with plan.   Return in about 1 week (around 10/16/2020) for f/u dr. Lorin Picket- f/u vaginal mass.   10/09/2020

## 2020-10-09 NOTE — Addendum Note (Signed)
Addended by: Metro Kung on: 10/09/2020 12:13 PM   Modules accepted: Orders

## 2020-10-09 NOTE — Telephone Encounter (Signed)
Spoke to patient. Will have her come in on Tuesday for visit with Dr Despina Hidden.

## 2020-10-09 NOTE — Telephone Encounter (Signed)
[facility] called wanting patient seen first available next week patient had what they believe to be a mass fall out of her vagina she was seen by Dr Ladona Ridgel today patient ph# [phone]

## 2020-10-13 ENCOUNTER — Other Ambulatory Visit: Payer: Self-pay

## 2020-10-13 ENCOUNTER — Encounter: Payer: Self-pay | Admitting: Obstetrics & Gynecology

## 2020-10-13 ENCOUNTER — Ambulatory Visit (INDEPENDENT_AMBULATORY_CARE_PROVIDER_SITE_OTHER): Payer: BC Managed Care – PPO | Admitting: Obstetrics & Gynecology

## 2020-10-13 VITALS — BP 138/89 | HR 74 | Ht 65.0 in | Wt 144.0 lb

## 2020-10-13 DIAGNOSIS — N8110 Cystocele, unspecified: Secondary | ICD-10-CM | POA: Diagnosis not present

## 2020-10-13 DIAGNOSIS — N993 Prolapse of vaginal vault after hysterectomy: Secondary | ICD-10-CM

## 2020-10-13 MED ORDER — IMVEXXY MAINTENANCE PACK 10 MCG VA INST: 1.0000 | VAGINAL_INSERT | VAGINAL | 11 refills | Status: DC

## 2020-10-13 NOTE — Progress Notes (Signed)
Chief Complaint  Patient presents with   Gynecologic Exam      55 y.o. G2P2 No LMP recorded. Patient has had a hysterectomy. The current method of family planning is status post hysterectomy.  Outpatient Encounter Medications as of 10/13/2020  Medication Sig   ALPRAZolam (XANAX) 0.5 MG tablet Take 1 tablet (0.5 mg total) by mouth 2 (two) times daily as needed for anxiety.   [START ON 10/15/2020] Estradiol (IMVEXXY MAINTENANCE PACK) 10 MCG INST Place 1 tablet vaginally 2 (two) times a week.   estradiol (VIVELLE-DOT) 0.1 MG/24HR patch APPLY 1 PATCH EXTERNALLY TO THE SKIN 2 TIMES A WEEK AS DIRECTED   hydrochlorothiazide (HYDRODIURIL) 25 MG tablet TAKE 1 TABLET(25 MG) BY MOUTH EVERY MORNING   ibuprofen (IBU) 800 MG tablet Take 1 tablet (800 mg total) by mouth every 8 (eight) hours as needed. for pain   potassium chloride (KLOR-CON) 10 MEQ tablet TAKE 1 TABLET(10 MEQ) BY MOUTH DAILY   topiramate (TOPAMAX) 25 MG tablet Take 1 tablet (25 mg total) by mouth 2 (two) times daily.   [DISCONTINUED] benzonatate (TESSALON) 100 MG capsule Take 1 capsule (100 mg total) by mouth every 8 (eight) hours.   [DISCONTINUED] HYDROcodone-acetaminophen (NORCO/VICODIN) 5-325 MG tablet Take 1 tablet by mouth every 4 (four) hours as needed for moderate pain.   No facility-administered encounter medications on file as of 10/13/2020.    Subjective Pt noticed this Thursday night, a bulge the size of a golf ball in her vagina No pain No bleeding No dyspareunia No urinary loss at all  Past Medical History:  Diagnosis Date   Bilateral groin pain 07/14/2015   Complication of anesthesia    Current use of estrogen therapy 10/17/2012   Hyperlipidemia 11/22/2019   Hypertension    Kidney stone 09/2008   Migraines    PONV (postoperative nausea and vomiting)     Past Surgical History:  Procedure Laterality Date   ABDOMINAL HYSTERECTOMY     COLONOSCOPY N/A 05/20/2016   Procedure: COLONOSCOPY;  Surgeon: West Bali, MD;  Location: [facility];  Service: Endoscopy;  Laterality: N/A;  11:30 AM - pt knows to arrive at 10:00   lap lso      OB History     Gravida  2   Para  2   Term      Preterm      AB      Living  2      SAB      IAB      Ectopic      Multiple      Live Births  2           Allergies  Allergen Reactions   Anesthesia S-I-40 [Propofol]     Complications of anesthesia - reported by pt   Lexapro [Escitalopram] Palpitations    Made her feel "weird"     Social History   Socioeconomic History   Marital status: Married    Spouse name: Not on file   Number of children: Not on file   Years of education: Not on file   Highest education level: Not on file  Occupational History   Not on file  Tobacco Use   Smoking status: Never   Smokeless tobacco: Never  Vaping Use   Vaping Use: Never used  Substance and Sexual Activity   Alcohol use: No    Alcohol/week: 2.0 standard drinks    Types: 2 Glasses of wine per week  Comment: occ   Drug use: No   Sexual activity: Yes    Birth control/protection: Surgical    Comment: hyst  Other Topics Concern   Not on file  Social History Narrative   Not on file   Social Determinants of Health   Financial Resource Strain: Not on file  Food Insecurity: Not on file  Transportation Needs: Not on file  Physical Activity: Not on file  Stress: Not on file  Social Connections: Not on file    Family History  Problem Relation Age of Onset   Diabetes Mother    Hypertension Mother    Depression Mother    Other Mother        committed suicide   Diabetes Maternal Grandmother     Medications:       Current Outpatient Medications:    ALPRAZolam (XANAX) 0.5 MG tablet, Take 1 tablet (0.5 mg total) by mouth 2 (two) times daily as needed for anxiety., Disp: 15 tablet, Rfl: 0   [START ON 10/15/2020] Estradiol (IMVEXXY MAINTENANCE PACK) 10 MCG INST, Place 1 tablet vaginally 2 (two) times a week., Disp: 8 each, Rfl:  11   estradiol (VIVELLE-DOT) 0.1 MG/24HR patch, APPLY 1 PATCH EXTERNALLY TO THE SKIN 2 TIMES A WEEK AS DIRECTED, Disp: 8 patch, Rfl: 12   hydrochlorothiazide (HYDRODIURIL) 25 MG tablet, TAKE 1 TABLET(25 MG) BY MOUTH EVERY MORNING, Disp: 90 tablet, Rfl: 0   ibuprofen (IBU) 800 MG tablet, Take 1 tablet (800 mg total) by mouth every 8 (eight) hours as needed. for pain, Disp: 60 tablet, Rfl: 0   potassium chloride (KLOR-CON) 10 MEQ tablet, TAKE 1 TABLET(10 MEQ) BY MOUTH DAILY, Disp: 90 tablet, Rfl: 0   topiramate (TOPAMAX) 25 MG tablet, Take 1 tablet (25 mg total) by mouth 2 (two) times daily., Disp: 60 tablet, Rfl: 0  Objective Blood pressure 138/89, pulse 74, height 5\' 5"  (1.651 m), weight 144 lb (65.3 kg).  General WDWN female NAD Vulva:  normal appearing vulva with no masses, tenderness or lesions Vagina:  normal mucosa, no discharge, grade 2 cystocoele, vaginal vault prolapse Cervix:  absent Uterus:  absent Adnexa: ovaries: absent  Good vaginal estrogen effect   Pertinent ROS No burning with urination, frequency or urgency No nausea, vomiting or diarrhea Nor fever chills or other constitutional symptoms   Labs or studies     Impression Diagnoses this Encounter::   ICD-10-CM   1. Vaginal vault prolapse after hysterectomy, 2nd degree  N99.3     2. POP-Q stage 2 cystocele  N81.10       Established relevant diagnosis(es):   Plan/Recommendations: Meds ordered this encounter  Medications   Estradiol (IMVEXXY MAINTENANCE PACK) 10 MCG INST    Sig: Place 1 tablet vaginally 2 (two) times a week.    Dispense:  8 each    Refill:  11    Labs or Scans Ordered: No orders of the defined types were placed in this encounter.   Management:: Begin local estrogen therapy as well as vivelle dot Pt understands pessary and/or surgical repair(sacrospinous ligament fixation with or without cystocoele repair) is in her future, hopefully this will delay that  Follow up No follow-ups  on file.       All questions were answered.

## 2020-10-14 LAB — IGP, APTIMA HPV: HPV Aptima: NEGATIVE

## 2020-11-20 ENCOUNTER — Other Ambulatory Visit: Payer: Self-pay | Admitting: Family Medicine

## 2021-01-14 ENCOUNTER — Ambulatory Visit: Payer: BC Managed Care – PPO | Admitting: Obstetrics & Gynecology

## 2021-01-27 ENCOUNTER — Telehealth: Payer: Self-pay | Admitting: Family Medicine

## 2021-01-27 DIAGNOSIS — I10 Essential (primary) hypertension: Secondary | ICD-10-CM

## 2021-01-27 DIAGNOSIS — Z79899 Other long term (current) drug therapy: Secondary | ICD-10-CM

## 2021-01-27 DIAGNOSIS — E785 Hyperlipidemia, unspecified: Secondary | ICD-10-CM

## 2021-01-27 DIAGNOSIS — Z1329 Encounter for screening for other suspected endocrine disorder: Secondary | ICD-10-CM

## 2021-01-27 NOTE — Telephone Encounter (Signed)
Patient has appointment for physical 10/28 and needing labs done

## 2021-01-27 NOTE — Telephone Encounter (Signed)
Pt has lipid completed on 12/10/19 but no recent labs from [place]. Please advise. Thank you!

## 2021-01-29 ENCOUNTER — Telehealth: Payer: Self-pay | Admitting: Family Medicine

## 2021-01-29 NOTE — Telephone Encounter (Signed)
Patient was returning call to nurse. Please call when available   [phone]

## 2021-01-29 NOTE — Telephone Encounter (Signed)
Patient was returning call to nurse. Please call when available    [phone]  Pt contacted and verbalized understanding

## 2021-01-29 NOTE — Telephone Encounter (Signed)
Lab orders placed. Left message to return call  

## 2021-02-04 LAB — LIPID PANEL
Chol/HDL Ratio: 4 ratio (ref 0.0–4.4)
Cholesterol, Total: 230 mg/dL — ABNORMAL HIGH (ref 100–199)
HDL: 57 mg/dL (ref >39)
LDL Chol Calc (NIH): 158 mg/dL — ABNORMAL HIGH (ref 0–99)
Triglycerides: 87 mg/dL (ref 0–149)
VLDL Cholesterol Cal: 15 mg/dL (ref 5–40)

## 2021-02-04 LAB — CBC WITH DIFFERENTIAL/PLATELET
Basophils Absolute: 0.1 10*3/uL (ref 0.0–0.2)
Basos: 1 %
EOS (ABSOLUTE): 0.3 10*3/uL (ref 0.0–0.4)
Eos: 5 %
Hematocrit: 43.5 % (ref 34.0–46.6)
Hemoglobin: 14.5 g/dL (ref 11.1–15.9)
Immature Grans (Abs): 0 10*3/uL (ref 0.0–0.1)
Immature Granulocytes: 0 %
Lymphocytes Absolute: 1.5 10*3/uL (ref 0.7–3.1)
Lymphs: 24 %
MCH: 31 pg (ref 26.6–33.0)
MCHC: 33.3 g/dL (ref 31.5–35.7)
MCV: 93 fL (ref 79–97)
Monocytes Absolute: 0.5 10*3/uL (ref 0.1–0.9)
Monocytes: 8 %
Neutrophils Absolute: 3.8 10*3/uL (ref 1.4–7.0)
Neutrophils: 62 %
Platelets: 227 10*3/uL (ref 150–450)
RBC: 4.67 x10E6/uL (ref 3.77–5.28)
RDW: 11.8 % (ref 11.7–15.4)
WBC: 6.2 10*3/uL (ref 3.4–10.8)

## 2021-02-04 LAB — COMPREHENSIVE METABOLIC PANEL
ALT: 16 IU/L (ref 0–32)
AST: 19 IU/L (ref 0–40)
Albumin/Globulin Ratio: 1.8 (ref 1.2–2.2)
Albumin: 4.2 g/dL (ref 3.8–4.9)
Alkaline Phosphatase: 86 IU/L (ref 44–121)
BUN/Creatinine Ratio: 15 (ref 9–23)
BUN: 14 mg/dL (ref 6–24)
Bilirubin Total: 0.5 mg/dL (ref 0.0–1.2)
CO2: 24 mmol/L (ref 20–29)
Calcium: 9.5 mg/dL (ref 8.7–10.2)
Chloride: 102 mmol/L (ref 96–106)
Creatinine, Ser: 0.94 mg/dL (ref 0.57–1.00)
Globulin, Total: 2.4 g/dL (ref 1.5–4.5)
Glucose: 90 mg/dL (ref 70–99)
Potassium: 4.4 mmol/L (ref 3.5–5.2)
Sodium: 141 mmol/L (ref 134–144)
Total Protein: 6.6 g/dL (ref 6.0–8.5)
eGFR: 72 mL/min/{1.73_m2} (ref >59)

## 2021-02-04 LAB — TSH: TSH: 1.96 u[IU]/mL (ref 0.450–4.500)

## 2021-02-05 ENCOUNTER — Ambulatory Visit (INDEPENDENT_AMBULATORY_CARE_PROVIDER_SITE_OTHER): Payer: BC Managed Care – PPO | Admitting: Nurse Practitioner

## 2021-02-05 ENCOUNTER — Encounter: Payer: Self-pay | Admitting: Nurse Practitioner

## 2021-02-05 ENCOUNTER — Other Ambulatory Visit: Payer: Self-pay

## 2021-02-05 VITALS — BP 121/88 | HR 95 | Temp 97.3°F | Ht 65.0 in | Wt 149.0 lb

## 2021-02-05 DIAGNOSIS — E785 Hyperlipidemia, unspecified: Secondary | ICD-10-CM

## 2021-02-05 DIAGNOSIS — Z0000 Encounter for general adult medical examination without abnormal findings: Secondary | ICD-10-CM

## 2021-02-05 DIAGNOSIS — Z79899 Other long term (current) drug therapy: Secondary | ICD-10-CM

## 2021-02-05 NOTE — Progress Notes (Signed)
   Subjective:    Patient ID: Angela Salas, female    DOB: [birth year], 55 y.o.   MRN: [id number]  HPI The patient comes in today for a wellness visit.    A review of their health history was completed.  A review of medications was also completed.  Any needed refills; no  Eating habits: good  Falls/  MVA accidents in past few months: no  Regular exercise: walks regularly   Specialist pt sees on regular basis: no  Preventative health issues were discussed.   Additional concerns: weight gain and cholesterol    Review of Systems     Objective:   Physical Exam        Assessment & Plan:

## 2021-02-05 NOTE — Progress Notes (Signed)
Subjective:    Patient ID: Angela Salas, female    DOB: 01-03-1966, 55 y.o.   MRN: [id number]  HPI Patient presents for physical today for insurance. She has no major complaints or issues. Gets regular GYN exams. Eye, dental, mammogram and colonoscopy are UTD. Denies any accidents or falls within the last 6 months. Eats well and walks daily. Denies headaches, blurred vision, chest pain, SOB. Denies any abd pain, constipation, diarrhea, N/V. Denies any vaginal bleeding or pain has had a hysterectomy. Sexually active with same partner denies need for STD testing. Flu vaccine and Covid vaccine UTD. Denies any urinary issues such as pain bleeding, burning or frequency.    Review of Systems  Constitutional:  Negative for chills, fatigue and fever.  HENT:  Negative for sore throat and trouble swallowing.   Respiratory:  Negative for cough, chest tightness, shortness of breath and wheezing.   Gastrointestinal:  Negative for abdominal pain, blood in stool, constipation, diarrhea, nausea and vomiting.  Genitourinary:  Negative for difficulty urinating, dysuria, enuresis, frequency, genital sores, pelvic pain, urgency, vaginal bleeding and vaginal pain.  Neurological:  Negative for dizziness and headaches.  Depression screen [facility] 2/9 02/05/2021  Decreased Interest 0  Down, Depressed, Hopeless 0  PHQ - 2 Score 0  Altered sleeping 0  Tired, decreased energy 0  Change in appetite 0  Feeling bad or failure about yourself  0  Trouble concentrating 0  Moving slowly or fidgety/restless 0  Suicidal thoughts 0  PHQ-9 Score 0  Difficult doing work/chores Not difficult at all  [facility]: mother had HTN and elevated cholesterol     Objective:   Physical Exam Constitutional:      General: She is not in acute distress.    Appearance: Normal appearance.  Neck:     Comments: Thyroid non-tender , no nodules or masses palpated, no enlargement or goiter noted.  Cardiovascular:     Rate and Rhythm: Normal  rate and regular rhythm.     Heart sounds: Normal heart sounds. No murmur heard. Pulmonary:     Effort: Pulmonary effort is normal.     Breath sounds: Normal breath sounds.  Abdominal:     General: There is no distension.     Palpations: Abdomen is soft.     Tenderness: There is no abdominal tenderness.  Musculoskeletal:     Cervical back: Neck supple. No tenderness.  Skin:    General: Skin is warm and dry.  Neurological:     Mental Status: She is alert and oriented to person, place, and time.  Psychiatric:        Mood and Affect: Mood normal.        Behavior: Behavior normal.        Thought Content: Thought content normal.        Judgment: Judgment normal.  Today's Vitals   02/05/21 1047  BP: 121/88  Pulse: 95  Temp: (!) 97.3 F (36.3 C)  SpO2: 98%  Weight: 149 lb (67.6 kg)  Height: 5' 5"  (1.651 m)   Body mass index is 24.79 kg/m. Results for orders placed or performed in visit on 01/27/21  CBC with Differential  Result Value Ref Range   WBC 6.2 3.4 - 10.8 x10E3/uL   RBC 4.67 3.77 - 5.28 x10E6/uL   Hemoglobin 14.5 11.1 - 15.9 g/dL   Hematocrit 43.5 34.0 - 46.6 %   MCV 93 79 - 97 fL   MCH 31.0 26.6 - 33.0 pg   MCHC 33.3  31.5 - 35.7 g/dL   RDW 11.8 11.7 - 15.4 %   Platelets 227 150 - 450 x10E3/uL   Neutrophils 62 Not Estab. %   Lymphs 24 Not Estab. %   Monocytes 8 Not Estab. %   Eos 5 Not Estab. %   Basos 1 Not Estab. %   Neutrophils Absolute 3.8 1.4 - 7.0 x10E3/uL   Lymphocytes Absolute 1.5 0.7 - 3.1 x10E3/uL   Monocytes Absolute 0.5 0.1 - 0.9 x10E3/uL   EOS (ABSOLUTE) 0.3 0.0 - 0.4 x10E3/uL   Basophils Absolute 0.1 0.0 - 0.2 x10E3/uL   Immature Granulocytes 0 Not Estab. %   Immature Grans (Abs) 0.0 0.0 - 0.1 x10E3/uL  Comprehensive Metabolic Panel (CMET)  Result Value Ref Range   Glucose 90 70 - 99 mg/dL   BUN 14 6 - 24 mg/dL   Creatinine, Ser 0.94 0.57 - 1.00 mg/dL   eGFR 72 >59 mL/min/1.73   BUN/Creatinine Ratio 15 9 - 23   Sodium 141 134 - 144 mmol/L    Potassium 4.4 3.5 - 5.2 mmol/L   Chloride 102 96 - 106 mmol/L   CO2 24 20 - 29 mmol/L   Calcium 9.5 8.7 - 10.2 mg/dL   Total Protein 6.6 6.0 - 8.5 g/dL   Albumin 4.2 3.8 - 4.9 g/dL   Globulin, Total 2.4 1.5 - 4.5 g/dL   Albumin/Globulin Ratio 1.8 1.2 - 2.2   Bilirubin Total 0.5 0.0 - 1.2 mg/dL   Alkaline Phosphatase 86 44 - 121 IU/L   AST 19 0 - 40 IU/L   ALT 16 0 - 32 IU/L  Lipid Profile  Result Value Ref Range   Cholesterol, Total 230 (H) 100 - 199 mg/dL   Triglycerides 87 0 - 149 mg/dL   HDL 57 >39 mg/dL   VLDL Cholesterol Cal 15 5 - 40 mg/dL   LDL Chol Calc (NIH) 158 (H) 0 - 99 mg/dL   Chol/HDL Ratio 4.0 0.0 - 4.4 ratio  TSH  Result Value Ref Range   TSH 1.960 0.450 - 4.500 uIU/mL   Reviewed labs with patient. Has had a progressive increase in LDL cholesterol with no changes in diet or activity.        Assessment & Plan:   Problem List Items Addressed This Visit       Other   Hyperlipidemia   Relevant Orders   Lipid panel   Other Visit Diagnoses     Routine general medical examination at a health care facility    -  Primary   High risk medication use       Relevant Orders   Hepatic function panel       Plan: Rosuvastatin 58m once daily at bedtime  Repeat Liver and Lipid profile in 2-3 months Continue healthy eating and daily walking with weight bearing exercises.  Return in about 6 months (around 08/06/2021).

## 2021-02-06 ENCOUNTER — Encounter: Payer: Self-pay | Admitting: Nurse Practitioner

## 2021-02-08 ENCOUNTER — Telehealth: Payer: Self-pay | Admitting: Nurse Practitioner

## 2021-02-08 MED ORDER — ROSUVASTATIN CALCIUM 5 MG PO TABS
ORAL_TABLET | ORAL | 0 refills | Status: DC
Start: 2021-02-08 — End: 2021-02-12

## 2021-02-08 NOTE — Telephone Encounter (Signed)
Per office note 02/05/21: Rosuvastatin 5mg  once daily at bedtime   Rosuvastatin 5 mg sent to pharmacy and pt is aware

## 2021-02-08 NOTE — Telephone Encounter (Signed)
Patient was seen on 02/05/21 and cholesterol medication was suppose to be called into [facility].

## 2021-02-12 ENCOUNTER — Other Ambulatory Visit: Payer: Self-pay | Admitting: Nurse Practitioner

## 2021-02-12 MED ORDER — ROSUVASTATIN CALCIUM 5 MG PO TABS: ORAL_TABLET | ORAL | 0 refills | Status: DC

## 2021-02-18 ENCOUNTER — Other Ambulatory Visit: Payer: Self-pay | Admitting: Family Medicine

## 2021-02-18 ENCOUNTER — Other Ambulatory Visit: Payer: Self-pay | Admitting: *Deleted

## 2021-02-18 MED ORDER — POTASSIUM CHLORIDE ER 10 MEQ PO TBCR: EXTENDED_RELEASE_TABLET | ORAL | 0 refills | Status: DC

## 2021-04-21 ENCOUNTER — Other Ambulatory Visit: Payer: Self-pay | Admitting: Obstetrics & Gynecology

## 2021-04-21 DIAGNOSIS — Z1231 Encounter for screening mammogram for malignant neoplasm of breast: Secondary | ICD-10-CM

## 2021-05-10 ENCOUNTER — Ambulatory Visit
Admission: EM | Admit: 2021-05-10 | Discharge: 2021-05-10 | Disposition: A | Discharge status: Home / Self Care | Payer: BC Managed Care – PPO | Attending: Urgent Care | Admitting: Urgent Care

## 2021-05-10 ENCOUNTER — Encounter: Payer: Self-pay | Admitting: Emergency Medicine

## 2021-05-10 ENCOUNTER — Other Ambulatory Visit: Payer: Self-pay

## 2021-05-10 DIAGNOSIS — M79645 Pain in left finger(s): Secondary | ICD-10-CM | POA: Diagnosis not present

## 2021-05-10 DIAGNOSIS — Z23 Encounter for immunization: Secondary | ICD-10-CM

## 2021-05-10 DIAGNOSIS — S61217A Laceration without foreign body of left little finger without damage to nail, initial encounter: Secondary | ICD-10-CM

## 2021-05-10 MED ORDER — TETANUS-DIPHTH-ACELL PERTUSSIS 5-2.5-18.5 LF-MCG/0.5 IM SUSY
0.5000 mL | PREFILLED_SYRINGE | Freq: Once | INTRAMUSCULAR | Status: AC
  Administered 2021-05-10: 0.5 mL via INTRAMUSCULAR

## 2021-05-10 NOTE — ED Triage Notes (Signed)
Pt reports was slicing vegetables and reports cut the side of left little finger. Bleeding controlled.

## 2021-05-10 NOTE — ED Provider Notes (Signed)
[facility]   MRN: [id number] DOB: Mar 22, 1966  Subjective:   Angela Salas is a 56 y.o. female presenting for suffering an accidental laceration to the left inside portion of her little finger.  Cannot recall when her last Tdap was.  She came straight to our clinic.  No current facility-administered medications for this encounter.  Current Outpatient Medications:    estradiol (VIVELLE-DOT) 0.1 MG/24HR patch, APPLY 1 PATCH EXTERNALLY TO THE SKIN 2 TIMES A WEEK AS DIRECTED, Disp: 8 patch, Rfl: 12   hydrochlorothiazide (HYDRODIURIL) 25 MG tablet, TAKE 1 TABLET(25 MG) BY MOUTH EVERY MORNING, Disp: 90 tablet, Rfl: 0   ibuprofen (IBU) 800 MG tablet, Take 1 tablet (800 mg total) by mouth every 8 (eight) hours as needed. for pain, Disp: 60 tablet, Rfl: 0   potassium chloride (KLOR-CON) 10 MEQ tablet, TAKE 1 TABLET(10 MEQ) BY MOUTH DAILY, Disp: 90 tablet, Rfl: 0   rosuvastatin (CRESTOR) 5 MG tablet, Take one tablet po at bedtime, Disp: 90 tablet, Rfl: 0   topiramate (TOPAMAX) 25 MG tablet, Take 1 tablet (25 mg total) by mouth 2 (two) times daily., Disp: 60 tablet, Rfl: 0   Allergies  Allergen Reactions   Anesthesia S-I-40 [Propofol]     Complications of anesthesia - reported by pt   Lexapro [Escitalopram] Palpitations    Made her feel "weird"     Past Medical History:  Diagnosis Date   Bilateral groin pain 07/14/2015   Complication of anesthesia    Current use of estrogen therapy 10/17/2012   Hyperlipidemia 11/22/2019   Hypertension    Kidney stone 09/2008   Migraines    PONV (postoperative nausea and vomiting)      Past Surgical History:  Procedure Laterality Date   ABDOMINAL HYSTERECTOMY     COLONOSCOPY N/A 05/20/2016   Procedure: COLONOSCOPY;  Surgeon: West Bali, MD;  Location: [facility];  Service: Endoscopy;  Laterality: N/A;  11:30 AM - pt knows to arrive at 10:00   lap lso      Family History  Problem Relation Age of Onset   Diabetes Mother     Hypertension Mother    Depression Mother    Other Mother        committed suicide   Diabetes Maternal Grandmother     Social History   Tobacco Use   Smoking status: Never   Smokeless tobacco: Never  Vaping Use   Vaping Use: Never used  Substance Use Topics   Alcohol use: No    Alcohol/week: 2.0 standard drinks    Types: 2 Glasses of wine per week    Comment: occ   Drug use: No    ROS   Objective:   Vitals: BP (!) 135/92 (BP Location: Right Arm)    Pulse 68    Temp 98.1 F (36.7 C) (Oral)    Resp 18    Ht 5\' 5"  (1.651 m)    Wt 140 lb (63.5 kg)    SpO2 97%    BMI 23.30 kg/m   Physical Exam Constitutional:      General: She is not in acute distress.    Appearance: Normal appearance. She is well-developed. She is not ill-appearing, toxic-appearing or diaphoretic.  HENT:     Head: Normocephalic and atraumatic.     Nose: Nose normal.     Mouth/Throat:     Mouth: Mucous membranes are moist.  Eyes:     General: No scleral icterus.  Right eye: No discharge.        Left eye: No discharge.     Extraocular Movements: Extraocular movements intact.  Cardiovascular:     Rate and Rhythm: Normal rate.  Pulmonary:     Effort: Pulmonary effort is normal.  Musculoskeletal:       Hands:     Comments: Full range of motion prior to and after laceration repair.  No loss of sensation.  Brisk capillary refill.  Skin:    General: Skin is warm and dry.  Neurological:     General: No focal deficit present.     Mental Status: She is alert and oriented to person, place, and time.  Psychiatric:        Mood and Affect: Mood normal.        Behavior: Behavior normal.    PROCEDURE NOTE: laceration repair Verbal consent obtained from patient.  Local anesthesia with 4cc Lidocaine 2% without epinephrine.  Wound explored for tendon, ligament damage. Wound scrubbed with soap and water and rinsed. Wound closed with #3 5-0 Ethilon (simple interrupted) sutures.  Wound cleansed and  dressed.   Assessment and Plan :   PDMP not reviewed this encounter.  1. Finger pain, left   2. Laceration of left little finger without foreign body without damage to nail, initial encounter   3. Need for diphtheria-tetanus-pertussis (Tdap) vaccine    Tdap updated in clinic. Laceration repaired successfully. Wound care reviewed. Recommended Tylenol and/or ibuprofen for pain control. Return-to-clinic precautions discussed, patient verbalized understanding. Otherwise, follow up in 10 days for suture removal. Counseled patient on potential for adverse effects with medications prescribed/recommended today, ER and return-to-clinic precautions discussed, patient verbalized understanding.    Wallis Bamberg, [place] 05/10/21 [phone]

## 2021-05-10 NOTE — Discharge Instructions (Signed)

## 2021-05-10 NOTE — ED Notes (Signed)
Site covered with non-adherent dressing and telfa secured with medical tape. Pt tolerated well. S/S of infection, site cleaning/management reviewed.Pt verbalized understanding.

## 2021-05-10 NOTE — ED Notes (Addendum)
Site soaked in hibicleanse wiped clean with moistened gauze. Pt tolerated well.

## 2021-05-17 ENCOUNTER — Ambulatory Visit
Admission: RE | Admit: 2021-05-17 | Discharge: 2021-05-17 | Disposition: A | Discharge status: Home / Self Care | Payer: BC Managed Care – PPO | Source: Ambulatory Visit | Attending: Obstetrics & Gynecology | Admitting: Obstetrics & Gynecology

## 2021-05-17 ENCOUNTER — Other Ambulatory Visit: Payer: Self-pay

## 2021-05-17 DIAGNOSIS — Z1231 Encounter for screening mammogram for malignant neoplasm of breast: Secondary | ICD-10-CM

## 2021-05-19 ENCOUNTER — Other Ambulatory Visit: Payer: Self-pay | Admitting: Family Medicine

## 2021-05-19 ENCOUNTER — Encounter: Payer: Self-pay | Admitting: *Deleted

## 2021-05-20 ENCOUNTER — Encounter: Payer: Self-pay | Admitting: *Deleted

## 2021-08-09 ENCOUNTER — Other Ambulatory Visit: Payer: Self-pay | Admitting: Nurse Practitioner

## 2021-08-16 ENCOUNTER — Other Ambulatory Visit: Payer: Self-pay | Admitting: *Deleted

## 2021-08-16 DIAGNOSIS — E785 Hyperlipidemia, unspecified: Secondary | ICD-10-CM

## 2021-08-16 DIAGNOSIS — Z79899 Other long term (current) drug therapy: Secondary | ICD-10-CM

## 2021-08-17 ENCOUNTER — Other Ambulatory Visit: Payer: Self-pay | Admitting: Family Medicine

## 2021-08-17 NOTE — Telephone Encounter (Signed)
Sent my chart message 08/17/21 ?

## 2021-08-18 LAB — HEPATIC FUNCTION PANEL
ALT: 24 IU/L (ref 0–32)
AST: 24 IU/L (ref 0–40)
Albumin: 4.3 g/dL (ref 3.8–4.9)
Alkaline Phosphatase: 81 IU/L (ref 44–121)
Bilirubin Total: 0.6 mg/dL (ref 0.0–1.2)
Bilirubin, Direct: 0.15 mg/dL (ref 0.00–0.40)
Total Protein: 6.5 g/dL (ref 6.0–8.5)

## 2021-08-18 LAB — LIPID PANEL
Chol/HDL Ratio: 2.5 ratio (ref 0.0–4.4)
Cholesterol, Total: 157 mg/dL (ref 100–199)
HDL: 62 mg/dL (ref >39)
LDL Chol Calc (NIH): 81 mg/dL (ref 0–99)
Triglycerides: 69 mg/dL (ref 0–149)
VLDL Cholesterol Cal: 14 mg/dL (ref 5–40)

## 2021-09-10 NOTE — Telephone Encounter (Signed)
Has appointment 6/21 @ 9:20

## 2021-09-10 NOTE — Telephone Encounter (Signed)
Sent second request to schedule appointment 09/10/21

## 2021-09-11 ENCOUNTER — Other Ambulatory Visit: Payer: Self-pay | Admitting: Adult Health

## 2021-09-13 ENCOUNTER — Other Ambulatory Visit: Payer: Self-pay | Admitting: *Deleted

## 2021-09-14 ENCOUNTER — Other Ambulatory Visit: Payer: Self-pay | Admitting: Family Medicine

## 2021-09-14 DIAGNOSIS — G43109 Migraine with aura, not intractable, without status migrainosus: Secondary | ICD-10-CM

## 2021-09-15 ENCOUNTER — Telehealth: Payer: Self-pay

## 2021-09-15 NOTE — Telephone Encounter (Signed)
Pt made appt with Dr Lorin Picket on July 12 for medication follow up she had to reschedule her 06/21 appt said she had to work but she needs a refill on her blood pressure med?

## 2021-09-15 NOTE — Telephone Encounter (Signed)
Patient informed her prescriptions have already been sent to the pharmacy on 6/2

## 2021-09-29 ENCOUNTER — Ambulatory Visit: Payer: BC Managed Care – PPO | Admitting: Family Medicine

## 2021-10-11 ENCOUNTER — Emergency Department (HOSPITAL_COMMUNITY): Payer: BC Managed Care – PPO

## 2021-10-11 ENCOUNTER — Encounter (HOSPITAL_COMMUNITY): Payer: Self-pay

## 2021-10-11 ENCOUNTER — Other Ambulatory Visit: Payer: Self-pay

## 2021-10-11 ENCOUNTER — Emergency Department (HOSPITAL_COMMUNITY)
Admission: EM | Admit: 2021-10-11 | Discharge: 2021-10-11 | Disposition: A | Discharge status: Home / Self Care | Payer: BC Managed Care – PPO | Attending: Emergency Medicine | Admitting: Emergency Medicine

## 2021-10-11 DIAGNOSIS — N200 Calculus of kidney: Secondary | ICD-10-CM | POA: Diagnosis not present

## 2021-10-11 DIAGNOSIS — Z79899 Other long term (current) drug therapy: Secondary | ICD-10-CM | POA: Insufficient documentation

## 2021-10-11 DIAGNOSIS — I10 Essential (primary) hypertension: Secondary | ICD-10-CM | POA: Diagnosis not present

## 2021-10-11 DIAGNOSIS — R1032 Left lower quadrant pain: Secondary | ICD-10-CM | POA: Diagnosis present

## 2021-10-11 LAB — URINALYSIS, ROUTINE W REFLEX MICROSCOPIC
Bilirubin Urine: NEGATIVE
Glucose, UA: NEGATIVE mg/dL
Ketones, ur: NEGATIVE mg/dL
Leukocytes,Ua: NEGATIVE
Nitrite: NEGATIVE
Protein, ur: 30 mg/dL — AB
RBC / HPF: >50 RBC/hpf — ABNORMAL HIGH (ref 0–5)
Specific Gravity, Urine: 1.021 (ref 1.005–1.030)
pH: 6 (ref 5.0–8.0)

## 2021-10-11 LAB — COMPREHENSIVE METABOLIC PANEL
ALT: 43 U/L (ref 0–44)
AST: 39 U/L (ref 15–41)
Albumin: 4.4 g/dL (ref 3.5–5.0)
Alkaline Phosphatase: 66 U/L (ref 38–126)
Anion gap: 7 (ref 5–15)
BUN: 12 mg/dL (ref 6–20)
CO2: 25 mmol/L (ref 22–32)
Calcium: 9.1 mg/dL (ref 8.9–10.3)
Chloride: 105 mmol/L (ref 98–111)
Creatinine, Ser: 0.73 mg/dL (ref 0.44–1.00)
GFR, Estimated: >60 mL/min (ref >60)
Glucose, Bld: 98 mg/dL (ref 70–99)
Potassium: 3.6 mmol/L (ref 3.5–5.1)
Sodium: 137 mmol/L (ref 135–145)
Total Bilirubin: 0.8 mg/dL (ref 0.3–1.2)
Total Protein: 7.7 g/dL (ref 6.5–8.1)

## 2021-10-11 LAB — CBC WITH DIFFERENTIAL/PLATELET
Abs Immature Granulocytes: 0.01 10*3/uL (ref 0.00–0.07)
Basophils Absolute: 0.1 10*3/uL (ref 0.0–0.1)
Basophils Relative: 1 %
Eosinophils Absolute: 0.3 10*3/uL (ref 0.0–0.5)
Eosinophils Relative: 5 %
HCT: 44.3 % (ref 36.0–46.0)
Hemoglobin: 15.1 g/dL — ABNORMAL HIGH (ref 12.0–15.0)
Immature Granulocytes: 0 %
Lymphocytes Relative: 28 %
Lymphs Abs: 1.9 10*3/uL (ref 0.7–4.0)
MCH: 31.5 pg (ref 26.0–34.0)
MCHC: 34.1 g/dL (ref 30.0–36.0)
MCV: 92.3 fL (ref 80.0–100.0)
Monocytes Absolute: 0.6 10*3/uL (ref 0.1–1.0)
Monocytes Relative: 9 %
Neutro Abs: 3.8 10*3/uL (ref 1.7–7.7)
Neutrophils Relative %: 57 %
Platelets: 222 10*3/uL (ref 150–400)
RBC: 4.8 MIL/uL (ref 3.87–5.11)
RDW: 12.3 % (ref 11.5–15.5)
WBC: 6.7 10*3/uL (ref 4.0–10.5)
nRBC: 0 % (ref 0.0–0.2)

## 2021-10-11 LAB — MAGNESIUM: Magnesium: 2 mg/dL (ref 1.7–2.4)

## 2021-10-11 LAB — LIPASE, BLOOD: Lipase: 41 U/L (ref 11–51)

## 2021-10-11 MED ORDER — LACTATED RINGERS IV BOLUS
1000.0000 mL | Freq: Once | INTRAVENOUS | Status: AC
  Administered 2021-10-11: 1000 mL via INTRAVENOUS

## 2021-10-11 MED ORDER — ONDANSETRON HCL 4 MG/2ML IJ SOLN
4.0000 mg | Freq: Once | INTRAMUSCULAR | Status: AC
  Administered 2021-10-11: 4 mg via INTRAVENOUS
  Filled 2021-10-11: qty 2

## 2021-10-11 MED ORDER — FENTANYL CITRATE (PF) 100 MCG/2ML IJ SOLN
100.0000 ug | Freq: Once | INTRAMUSCULAR | Status: AC
  Administered 2021-10-11: 100 ug via INTRAVENOUS
  Filled 2021-10-11: qty 2

## 2021-10-11 MED ORDER — IOHEXOL 300 MG/ML  SOLN
100.0000 mL | Freq: Once | INTRAMUSCULAR | Status: AC | PRN
  Administered 2021-10-11: 100 mL via INTRAVENOUS

## 2021-10-11 NOTE — ED Triage Notes (Signed)
Patient with complaints of left lower abdominal pain for a week now is moving into groin.

## 2021-10-11 NOTE — Discharge Instructions (Signed)
On your CT scan, it appears that you had a recent kidney stone on the left side that has now passed into your bladder.  There is a telephone number below to call to schedule appointment with urology.  Continue to stay well-hydrated and return to the ED for any recurrence of concerning symptoms.

## 2021-10-11 NOTE — ED Provider Notes (Signed)
[facility] Provider Note   CSN: [id number] Arrival date & time: 10/11/21  0800     History {Add pertinent medical, surgical, social history, OB history to HPI:1} Chief Complaint  Patient presents with   Abdominal Pain    Angela Salas is a 56 y.o. female.   Abdominal Pain Associated symptoms: diarrhea (Chronic), dysuria and nausea   Patient presents for left lower quadrant abdominal pain.  Medical history includes HTN, HLD, nephrolithiasis, migraine headaches, anxiety.  Surgical history includes TAH and vaginal prolapse surgery.  She endorses lower abdominal pain that is worse on the left side that has been present over the past week.  Over the past 2 to 3 days, she has experienced dysuria.  This morning, she had continued lower abdominal pain in addition to left-sided groin pain and left-sided flank pain.  In addition to her pain, she endorses nausea.  She has not had vomiting.  She has had diarrhea but states that this is a chronic condition.  She denies fevers or chills.     Home Medications Prior to Admission medications   Medication Sig Start Date End Date Taking? Authorizing Provider  estradiol (VIVELLE-DOT) 0.1 MG/24HR patch APPLY 1 PATCH TOPICALLY TO THE SKIN 2 TIMES A WEEK AS DIRECTED 09/13/21   Cyril Mourning A, NP  hydrochlorothiazide (HYDRODIURIL) 25 MG tablet TAKE 1 TABLET(25 MG) BY MOUTH EVERY MORNING 09/10/21   Babs [name], MD  ibuprofen (ADVIL) 800 MG tablet TAKE 1 TABLET(800 MG) BY MOUTH EVERY 8 HOURS AS NEEDED FOR PAIN 09/15/21   Babs [name], MD  potassium chloride (KLOR-CON) 10 MEQ tablet TAKE 1 TABLET(10 MEQ) BY MOUTH DAILY 09/10/21   Babs [name], MD  rosuvastatin (CRESTOR) 5 MG tablet TAKE 1 TABLET BY MOUTH AT BEDTIME 08/14/21   Campbell Riches, NP  topiramate (TOPAMAX) 25 MG tablet Take 1 tablet (25 mg total) by mouth 2 (two) times daily. 10/09/20   Annalee Genta, DO      Allergies    Anesthesia s-i-40 [propofol] and Lexapro  [escitalopram]    Review of Systems   Review of Systems  Gastrointestinal:  Positive for abdominal pain, diarrhea (Chronic) and nausea.  Genitourinary:  Positive for dysuria and flank pain.  All other systems reviewed and are negative.   Physical Exam Updated Vital Signs Ht 5\' 5"  (1.651 m)   Wt 65.8 kg   BMI 24.13 kg/m  Physical Exam Vitals and nursing note reviewed.  Constitutional:      General: She is not in acute distress.    Appearance: She is well-developed. She is not ill-appearing, toxic-appearing or diaphoretic.     Comments: Appears uncomfortable  HENT:     Head: Normocephalic and atraumatic.     Mouth/Throat:     Mouth: Mucous membranes are moist.     Pharynx: Oropharynx is clear.  Eyes:     General: No scleral icterus.    Extraocular Movements: Extraocular movements intact.     Conjunctiva/sclera: Conjunctivae normal.  Cardiovascular:     Rate and Rhythm: Normal rate and regular rhythm.     Heart sounds: No murmur heard. Pulmonary:     Effort: Pulmonary effort is normal. No respiratory distress.  Abdominal:     Palpations: Abdomen is soft.     Tenderness: There is abdominal tenderness in the suprapubic area and left lower quadrant. There is no right CVA tenderness, left CVA tenderness, guarding or rebound.  Musculoskeletal:  General: No swelling.     Cervical back: Neck supple.  Skin:    General: Skin is warm and dry.     Coloration: Skin is not cyanotic, jaundiced or pale.  Neurological:     General: No focal deficit present.     Mental Status: She is alert and oriented to person, place, and time.  Psychiatric:        Mood and Affect: Mood normal.        Behavior: Behavior normal.     ED Results / Procedures / Treatments   Labs (all labs ordered are listed, but only abnormal results are displayed) Labs Reviewed - No data to display  EKG None  Radiology No results found.  Procedures Procedures  {Document cardiac monitor, telemetry  assessment procedure when appropriate:1}  Medications Ordered in ED Medications - No data to display  ED Course/ Medical Decision Making/ A&P                           Medical Decision Making Amount and/or Complexity of Data Reviewed Labs: ordered.   ***  {Document critical care time when appropriate:1} {Document review of labs and clinical decision tools ie heart score, Chads2Vasc2 etc:1}  {Document your independent review of radiology images, and any outside records:1} {Document your discussion with family members, caretakers, and with consultants:1} {Document social determinants of health affecting pt's care:1} {Document your decision making why or why not admission, treatments were needed:1} Final Clinical Impression(s) / ED Diagnoses Final diagnoses:  None    Rx / DC Orders ED Discharge Orders     None

## 2021-10-20 ENCOUNTER — Ambulatory Visit: Payer: BC Managed Care – PPO | Admitting: Family Medicine

## 2021-10-20 ENCOUNTER — Encounter: Payer: Self-pay | Admitting: Family Medicine

## 2021-10-20 VITALS — BP 124/76 | HR 59 | Temp 97.0°F | Ht 65.0 in | Wt 151.0 lb

## 2021-10-20 DIAGNOSIS — E785 Hyperlipidemia, unspecified: Secondary | ICD-10-CM

## 2021-10-20 DIAGNOSIS — I10 Essential (primary) hypertension: Secondary | ICD-10-CM | POA: Diagnosis not present

## 2021-10-20 MED ORDER — HYDROCHLOROTHIAZIDE 25 MG PO TABS
ORAL_TABLET | ORAL | 1 refills | Status: DC
Start: 2021-10-20 — End: 2022-06-24

## 2021-10-20 MED ORDER — TAMSULOSIN HCL 0.4 MG PO CAPS: 0.4000 mg | ORAL_CAPSULE | Freq: Every day | ORAL | 0 refills | Status: DC

## 2021-10-20 MED ORDER — POTASSIUM CHLORIDE ER 10 MEQ PO TBCR
EXTENDED_RELEASE_TABLET | ORAL | 1 refills | Status: DC
Start: 2021-10-20 — End: 2022-06-24

## 2021-10-20 MED ORDER — ROSUVASTATIN CALCIUM 5 MG PO TABS: ORAL_TABLET | ORAL | 1 refills | Status: DC

## 2021-10-20 NOTE — Progress Notes (Signed)
   Subjective:    Patient ID: Angela Salas, female    DOB: [birth year], 56 y.o.   MRN: [id number]  HPI  Patient here for follow up on medications. Patient here today for follow-up Takes her medicine on a regular basis Tries to watch diet relatively well Moods are doing well Stress levels good Eating healthy exercising regular basis Trying to keep her weight down Patient does take HCTZ.  Does take her potassium.  Also takes her cholesterol medicine.  No longer takes Topamax. She was recently in the hospital for kidney stone Magnesium look good CBC looked good overall with some hemoconcentration Lipase liver function kidney functions overall good Review of Systems     Objective:   Physical Exam  General-in no acute distress Eyes-no discharge Lungs-respiratory rate normal, CTA CV-no murmurs,RRR Extremities skin warm dry no edema Neuro grossly normal Behavior normal, alert       Assessment & Plan:  History kidney stone-Flomax given  HTN good control watch diet stay physically active  Hyperlipidemia lab work from November looked good continue current measures continue medication  Follow-up in approximately 6 months sooner if any problems  Shingles vaccine recommended

## 2021-10-20 NOTE — Patient Instructions (Signed)

## 2021-10-21 ENCOUNTER — Telehealth: Payer: Self-pay | Admitting: Family Medicine

## 2021-10-21 NOTE — Telephone Encounter (Signed)
Nurses-the tamsulosin is only for as needed use when she gets a kidney stone.  There is no need for her to have 90 tablets.  We only recommend 21 tablets at a time if she ever needs more she will let us know.  This is essentially for her to have on hand in case she does get a kidney stone.  It is essentially like having a emergency supply of tamsulosin.  Only use it when actually having symptoms of a kidney stone more than likely will only have to use it for a few days at a time-if further questions or concerns let me know thank you-please explain this to patient as well as pharmacy so they will understand thank you

## 2021-10-21 NOTE — Telephone Encounter (Signed)
[facility] Dr requesting 90 day supply on Tamsulosin 0.4 mg. Original script is for 21 tablets. Please advise. Thank you

## 2021-10-22 NOTE — Telephone Encounter (Signed)
Telephone call -Mailbox is full 

## 2021-11-01 NOTE — Telephone Encounter (Signed)
Patient notified of provider's recommendations and verbalized understanding.  

## 2021-11-10 ENCOUNTER — Other Ambulatory Visit: Payer: Self-pay | Admitting: Family Medicine

## 2022-04-06 ENCOUNTER — Encounter: Payer: Self-pay | Admitting: Family Medicine

## 2022-04-06 ENCOUNTER — Other Ambulatory Visit: Payer: Self-pay | Admitting: Family Medicine

## 2022-04-06 DIAGNOSIS — Z1231 Encounter for screening mammogram for malignant neoplasm of breast: Secondary | ICD-10-CM

## 2022-04-07 ENCOUNTER — Other Ambulatory Visit: Payer: Self-pay | Admitting: Family Medicine

## 2022-04-07 MED ORDER — ALPRAZOLAM 0.5 MG PO TABS: ORAL_TABLET | ORAL | 0 refills | Status: DC

## 2022-04-29 ENCOUNTER — Encounter: Payer: Self-pay | Admitting: Family Medicine

## 2022-04-29 DIAGNOSIS — Z1211 Encounter for screening for malignant neoplasm of colon: Secondary | ICD-10-CM

## 2022-05-04 ENCOUNTER — Encounter: Payer: Self-pay | Admitting: *Deleted

## 2022-05-20 ENCOUNTER — Telehealth: Payer: Self-pay | Admitting: *Deleted

## 2022-05-20 NOTE — Telephone Encounter (Signed)
  Procedure: Colonoscopy  Estimated body mass index is 25.13 kg/m as calculated from the following:   Height as of 10/20/21: 5\' 5"  (1.651 m).   Weight as of 10/20/21: 151 lb (68.5 kg).  Have you had a colonoscopy before?  05/20/16, Dr. Oneida Alar  Do you have family history of colon cancer?  no  Do you have a family history of polyps? yes  Previous colonoscopy with polyps removed? no  Do you have a history colorectal cancer?   no  Are you diabetic?  no  Do you have a prosthetic or mechanical heart valve? no  Do you have a pacemaker/defibrillator?   no  Have you had endocarditis/atrial fibrillation?  no  Do you use supplemental oxygen/CPAP?  no  Have you had joint replacement within the last 12 months?  no  Do you tend to be constipated or have to use laxatives?  no   Do you have history of alcohol use? If yes, how much and how often.  no  Do you have history or are you using drugs? If yes, what do are you  using?  no  Have you ever had a stroke/heart attack?  no  Have you ever had a heart or other vascular stent placed,?no  Do you take weight loss medication? no  female patients,: have you had a hysterectomy? yes                              are you post menopausal?  yes                              do you still have your menstrual cycle? no    Date of last menstrual period?   Do you take any blood-thinning medications such as: (Plavix, aspirin, Coumadin, Aggrenox, Brilinta, Xarelto, Eliquis, Pradaxa, Savaysa or Effient)? no  If yes we need the name, milligram, dosage and who is prescribing doctor:               Current Outpatient Medications  Medication Sig Dispense Refill   ALPRAZolam (XANAX) 0.5 MG tablet 1/2 to 1 twice daily as needed anxiety caution drowsiness home use only not with driving 20 tablet 0   estradiol (VIVELLE-DOT) 0.1 MG/24HR patch APPLY 1 PATCH TOPICALLY TO THE SKIN 2 TIMES A WEEK AS DIRECTED 8 patch 12   hydrochlorothiazide (HYDRODIURIL) 25 MG tablet  TAKE 1 TABLET(25 MG) BY MOUTH EVERY MORNING 90 tablet 1   ibuprofen (ADVIL) 800 MG tablet TAKE 1 TABLET(800 MG) BY MOUTH EVERY 8 HOURS AS NEEDED FOR PAIN 60 tablet 0   potassium chloride (KLOR-CON) 10 MEQ tablet 1 qd 90 tablet 1   rosuvastatin (CRESTOR) 5 MG tablet TAKE 1 TABLET BY MOUTH AT BEDTIME 90 tablet 1   tamsulosin (FLOMAX) 0.4 MG CAPS capsule TAKE 1 CAPSULE(0.4 MG) BY MOUTH DAILY 21 capsule 0   No current facility-administered medications for this visit.    Allergies  Allergen Reactions   Anesthesia S-I-40 [Propofol]     Complications of anesthesia - reported by pt   Lexapro [Escitalopram] Palpitations    Made her feel "weird"

## 2022-05-27 ENCOUNTER — Ambulatory Visit
Admission: RE | Admit: 2022-05-27 | Discharge: 2022-05-27 | Disposition: A | Discharge status: Home / Self Care | Payer: 59 | Source: Ambulatory Visit | Attending: Family Medicine | Admitting: Family Medicine

## 2022-05-27 DIAGNOSIS — Z1231 Encounter for screening mammogram for malignant neoplasm of breast: Secondary | ICD-10-CM

## 2022-05-31 ENCOUNTER — Other Ambulatory Visit: Payer: Self-pay | Admitting: *Deleted

## 2022-05-31 ENCOUNTER — Encounter: Payer: Self-pay | Admitting: *Deleted

## 2022-05-31 DIAGNOSIS — Z860100 Personal history of colon polyps, unspecified: Secondary | ICD-10-CM

## 2022-05-31 MED ORDER — NA SULFATE-K SULFATE-MG SULF 17.5-3.13-1.6 GM/177ML PO SOLN: ORAL | 0 refills | Status: DC

## 2022-05-31 NOTE — Telephone Encounter (Signed)
Pt has been scheduled for 06/17/22. Instructions mailed and prep sent to the pharmacy.

## 2022-05-31 NOTE — Telephone Encounter (Signed)
2018 colonoscopy with 5 mm polyp. (Nondiagnostic material on path). ASA 2.

## 2022-06-13 ENCOUNTER — Other Ambulatory Visit (HOSPITAL_COMMUNITY)
Admission: RE | Admit: 2022-06-13 | Discharge: 2022-06-13 | Disposition: A | Discharge status: Home / Self Care | Payer: 59 | Source: Ambulatory Visit | Attending: Internal Medicine | Admitting: Internal Medicine

## 2022-06-13 DIAGNOSIS — Z86010 Personal history of colonic polyps: Secondary | ICD-10-CM | POA: Insufficient documentation

## 2022-06-13 LAB — BASIC METABOLIC PANEL
Anion gap: 7 (ref 5–15)
BUN: 15 mg/dL (ref 6–20)
CO2: 26 mmol/L (ref 22–32)
Calcium: 8.8 mg/dL — ABNORMAL LOW (ref 8.9–10.3)
Chloride: 105 mmol/L (ref 98–111)
Creatinine, Ser: 0.78 mg/dL (ref 0.44–1.00)
GFR, Estimated: >60 mL/min (ref >60)
Glucose, Bld: 96 mg/dL (ref 70–99)
Potassium: 3.9 mmol/L (ref 3.5–5.1)
Sodium: 138 mmol/L (ref 135–145)

## 2022-06-15 NOTE — Telephone Encounter (Signed)
[employer] PA: Case Number: [id number] Review Date: 06/15/2022 9:52:34 AM Expiration Date: N/A Status: This member is not in scope for prior-authorization/notification for the services requested. You can save the case reference ID as validation of your request.

## 2022-06-17 ENCOUNTER — Ambulatory Visit (HOSPITAL_COMMUNITY): Payer: 59 | Admitting: Certified Registered Nurse Anesthetist

## 2022-06-17 ENCOUNTER — Ambulatory Visit (HOSPITAL_COMMUNITY)
Admission: RE | Admit: 2022-06-17 | Discharge: 2022-06-17 | Disposition: A | Discharge status: Home / Self Care | Payer: 59 | Attending: Internal Medicine | Admitting: Internal Medicine

## 2022-06-17 ENCOUNTER — Encounter (HOSPITAL_COMMUNITY): Payer: Self-pay

## 2022-06-17 ENCOUNTER — Ambulatory Visit (HOSPITAL_BASED_OUTPATIENT_CLINIC_OR_DEPARTMENT_OTHER): Payer: 59 | Admitting: Certified Registered Nurse Anesthetist

## 2022-06-17 ENCOUNTER — Other Ambulatory Visit: Payer: Self-pay

## 2022-06-17 ENCOUNTER — Encounter (HOSPITAL_COMMUNITY)
Admission: RE | Disposition: A | Discharge status: Home / Self Care | Payer: Self-pay | Source: Home / Self Care | Attending: Internal Medicine

## 2022-06-17 DIAGNOSIS — K648 Other hemorrhoids: Secondary | ICD-10-CM

## 2022-06-17 DIAGNOSIS — K5730 Diverticulosis of large intestine without perforation or abscess without bleeding: Secondary | ICD-10-CM | POA: Diagnosis not present

## 2022-06-17 DIAGNOSIS — I10 Essential (primary) hypertension: Secondary | ICD-10-CM | POA: Insufficient documentation

## 2022-06-17 DIAGNOSIS — Z09 Encounter for follow-up examination after completed treatment for conditions other than malignant neoplasm: Secondary | ICD-10-CM | POA: Diagnosis not present

## 2022-06-17 DIAGNOSIS — D125 Benign neoplasm of sigmoid colon: Secondary | ICD-10-CM

## 2022-06-17 DIAGNOSIS — Z1211 Encounter for screening for malignant neoplasm of colon: Secondary | ICD-10-CM | POA: Diagnosis present

## 2022-06-17 DIAGNOSIS — Z86010 Personal history of colonic polyps: Secondary | ICD-10-CM | POA: Insufficient documentation

## 2022-06-17 HISTORY — PX: POLYPECTOMY: SHX5525

## 2022-06-17 HISTORY — PX: COLONOSCOPY WITH PROPOFOL: SHX5780

## 2022-06-17 SURGERY — COLONOSCOPY WITH PROPOFOL
Anesthesia: General

## 2022-06-17 MED ORDER — LIDOCAINE HCL (CARDIAC) PF 100 MG/5ML IV SOSY
PREFILLED_SYRINGE | INTRAVENOUS | Status: DC | PRN
  Administered 2022-06-17: 50 mg via INTRAVENOUS

## 2022-06-17 MED ORDER — LACTATED RINGERS IV SOLN: INTRAVENOUS | Status: DC | PRN

## 2022-06-17 MED ORDER — STERILE WATER FOR IRRIGATION IR SOLN
Status: DC | PRN
  Administered 2022-06-17: 60 mL

## 2022-06-17 MED ORDER — PROPOFOL 10 MG/ML IV BOLUS
INTRAVENOUS | Status: DC | PRN
  Administered 2022-06-17: 20 mg via INTRAVENOUS
  Administered 2022-06-17: 40 mg via INTRAVENOUS
  Administered 2022-06-17: 20 mg via INTRAVENOUS
  Administered 2022-06-17 (×2): 30 mg via INTRAVENOUS
  Administered 2022-06-17: 100 mg via INTRAVENOUS
  Administered 2022-06-17: 20 mg via INTRAVENOUS

## 2022-06-17 NOTE — Transfer of Care (Signed)
Immediate Anesthesia Transfer of Care Note  Patient: Angela Salas  Procedure(s) Performed: COLONOSCOPY WITH PROPOFOL POLYPECTOMY  Patient Location: Endoscopy Unit  Anesthesia Type:General  Level of Consciousness: awake  Airway & Oxygen Therapy: Patient Spontanous Breathing  Post-op Assessment: Report given to RN and Post -op Vital signs reviewed and stable  Post vital signs: Reviewed and stable  Last Vitals:  Vitals Value Taken Time  BP 90/51 06/17/22 0822  Temp 36.8 C 06/17/22 0822  Pulse 69 06/17/22 0822  Resp 10 06/17/22 0822  SpO2 94 % 06/17/22 0822    Last Pain:  Vitals:   06/17/22 0822  TempSrc: Oral  PainSc: 0-No pain      Patients Stated Pain Goal: 9 ([id number])  Complications: No notable events documented.

## 2022-06-17 NOTE — Op Note (Signed)
[facility] Patient Name: Angela Salas Procedure Date: 06/17/2022 7:51 AM MRN: [id number] Date of Birth: 1965/06/06 Attending MD: Elon Alas. Abbey Chatters , [place], [id number] CSN: [id number] Age: 57 Admit Type: Outpatient Procedure:                Colonoscopy Indications:              High risk colon cancer surveillance: Personal                            history of colonic polyps Providers:                Elon Alas. Abbey Chatters, DO, Caprice Kluver, [place], Technician Referring MD:              Medicines:                See the Anesthesia note for documentation of the                            administered medications Complications:            No immediate complications. Estimated Blood Loss:     Estimated blood loss was minimal. Procedure:                Pre-Anesthesia Assessment:                           - The anesthesia plan was to use monitored                            anesthesia care (MAC).                           After obtaining informed consent, the colonoscope                            was passed under direct vision. Throughout the                            procedure, the patient's blood pressure, pulse, and                            oxygen saturations were monitored continuously. The                            [id number]-[id number] scope was introduced through                            the anus and advanced to the the cecum, identified                            by appendiceal orifice and ileocecal valve. The                            colonoscopy was performed without difficulty. The  patient tolerated the procedure well. The quality                            of the bowel preparation was evaluated using the                            BBPS [facility] Bowel Preparation Scale) with scores                            of: Right Colon = 3, Transverse Colon = 3 and Left                            Colon = 3 (entire  mucosa seen well with no residual                            staining, small fragments of stool or opaque                            liquid). The total BBPS score equals 9. Scope In: 8:01:39 AM Scope Out: 8:19:25 AM Scope Withdrawal Time: 0 hours 13 minutes 25 seconds  Total Procedure Duration: 0 hours 17 minutes 46 seconds  Findings:      Hemorrhoids were found on perianal exam.      Non-bleeding internal hemorrhoids were found during endoscopy.      A 7 mm polyp was found in the sigmoid colon. The polyp was sessile. The       polyp was removed with a cold snare. Resection and retrieval were       complete.      A few medium-mouthed diverticula were found in the sigmoid colon.      The exam was otherwise without abnormality. Impression:               - Hemorrhoids found on perianal exam.                           - Non-bleeding internal hemorrhoids.                           - One 7 mm polyp in the sigmoid colon, removed with                            a cold snare. Resected and retrieved.                           - Diverticulosis in the sigmoid colon.                           - The examination was otherwise normal. Moderate Sedation:      Per Anesthesia Care Recommendation:           - Patient has a contact number available for                            emergencies. The signs and symptoms of potential  delayed complications were discussed with the                            patient. Return to normal activities tomorrow.                            Written discharge instructions were provided to the                            patient.                           - Resume previous diet.                           - Continue present medications.                           - Await pathology results.                           - Repeat colonoscopy in 7-10 years for surveillance.                           - Return to GI clinic PRN. Procedure Code(s):        ---  Professional ---                           [phone], Colonoscopy, flexible; with removal of                            tumor(s), polyp(s), or other lesion(s) by snare                            technique Diagnosis Code(s):        --- Professional ---                           Z86.010, Personal history of colonic polyps                           D12.5, Benign neoplasm of sigmoid colon                           K64.8, Other hemorrhoids CPT copyright 2022 American Medical Association. All rights reserved. The codes documented in this report are preliminary and upon coder review may  be revised to meet current compliance requirements. Elon Alas. Abbey Chatters, DO [place] Abbey Chatters, DO 06/17/2022 8:21:30 AM This report has been signed electronically. Number of Addenda: 0

## 2022-06-17 NOTE — Anesthesia Postprocedure Evaluation (Signed)
Anesthesia Post Note  Patient: Angela Salas  Procedure(s) Performed: COLONOSCOPY WITH PROPOFOL POLYPECTOMY  Patient location during evaluation: Phase II Anesthesia Type: General Level of consciousness: awake Pain management: pain level controlled Vital Signs Assessment: post-procedure vital signs reviewed and stable Respiratory status: spontaneous breathing and respiratory function stable Cardiovascular status: blood pressure returned to baseline and stable Postop Assessment: no headache and no apparent nausea or vomiting Anesthetic complications: no Comments: Late entry   No notable events documented.   Last Vitals:  Vitals:   06/17/22 0724 06/17/22 0822  BP: 127/72 (!) 90/51  Pulse: 72 69  Resp: 11 10  Temp: 36.9 C 36.8 C  SpO2: 99% 94%    Last Pain:  Vitals:   06/17/22 0822  TempSrc: Oral  PainSc: 0-No pain                 Louann Sjogren

## 2022-06-17 NOTE — Discharge Instructions (Addendum)
  Colonoscopy Discharge Instructions  Read the instructions outlined below and refer to this sheet in the next few weeks. These discharge instructions provide you with general information on caring for yourself after you leave the hospital. Your doctor may also give you specific instructions. While your treatment has been planned according to the most current medical practices available, unavoidable complications occasionally occur.   ACTIVITY You may resume your regular activity, but move at a slower pace for the next 24 hours.  Take frequent rest periods for the next 24 hours.  Walking will help get rid of the air and reduce the bloated feeling in your belly (abdomen).  No driving for 24 hours (because of the medicine (anesthesia) used during the test).   Do not sign any important legal documents or operate any machinery for 24 hours (because of the anesthesia used during the test).  NUTRITION Drink plenty of fluids.  You may resume your normal diet as instructed by your doctor.  Begin with a light meal and progress to your normal diet. Heavy or fried foods are harder to digest and may make you feel sick to your stomach (nauseated).  Avoid alcoholic beverages for 24 hours or as instructed.  MEDICATIONS You may resume your normal medications unless your doctor tells you otherwise.  WHAT YOU CAN EXPECT TODAY Some feelings of bloating in the abdomen.  Passage of more gas than usual.  Spotting of blood in your stool or on the toilet paper.  IF YOU HAD POLYPS REMOVED DURING THE COLONOSCOPY: No aspirin products for 7 days or as instructed.  No alcohol for 7 days or as instructed.  Eat a soft diet for the next 24 hours.  FINDING OUT THE RESULTS OF YOUR TEST Not all test results are available during your visit. If your test results are not back during the visit, make an appointment with your caregiver to find out the results. Do not assume everything is normal if you have not heard from your  caregiver or the medical facility. It is important for you to follow up on all of your test results.  SEEK IMMEDIATE MEDICAL ATTENTION IF: You have more than a spotting of blood in your stool.  Your belly is swollen (abdominal distention).  You are nauseated or vomiting.  You have a temperature over 101.  You have abdominal pain or discomfort that is severe or gets worse throughout the day.   Your colonoscopy revealed 1 polyp(s) which I removed successfully. Await pathology results, my office will contact you. I recommend repeating colonoscopy in 7-10 years for surveillance purposes depending on pathology results.   You also have diverticulosis and internal hemorrhoids. I would recommend increasing fiber in your diet or adding OTC Benefiber/Metamucil. Be sure to drink at least 4 to 6 glasses of water daily. Follow-up with GI as needed.   I hope you have a great rest of your week!  Elon Alas. Abbey Chatters, D.O. Gastroenterology and Hepatology [facility]

## 2022-06-17 NOTE — Anesthesia Preprocedure Evaluation (Signed)
Anesthesia Evaluation  Patient identified by MRN, date of birth, ID band Patient awake    Reviewed: Allergy & Precautions, H&P , NPO status , Patient's Chart, lab work & pertinent test results, reviewed documented beta blocker date and time   History of Anesthesia Complications (+) PONV and history of anesthetic complications  Airway Mallampati: II  TM Distance: >3 FB Neck ROM: full    Dental no notable dental hx.    Pulmonary neg pulmonary ROS   Pulmonary exam normal breath sounds clear to auscultation       Cardiovascular Exercise Tolerance: Good hypertension, negative cardio ROS  Rhythm:regular Rate:Normal     Neuro/Psych  Headaches PSYCHIATRIC DISORDERS Anxiety     negative neurological ROS  negative psych ROS   GI/Hepatic negative GI ROS, Neg liver ROS,,,  Endo/Other  negative endocrine ROS    Renal/GU Renal diseasenegative Renal ROS  negative genitourinary   Musculoskeletal   Abdominal   Peds  Hematology negative hematology ROS (+)   Anesthesia Other Findings   Reproductive/Obstetrics negative OB ROS                             Anesthesia Physical Anesthesia Plan  ASA: 2  Anesthesia Plan: General   Post-op Pain Management:    Induction:   PONV Risk Score and Plan: Propofol infusion  Airway Management Planned:   Additional Equipment:   Intra-op Plan:   Post-operative Plan:   Informed Consent: I have reviewed the patients History and Physical, chart, labs and discussed the procedure including the risks, benefits and alternatives for the proposed anesthesia with the patient or authorized representative who has indicated his/her understanding and acceptance.     Dental Advisory Given  Plan Discussed with: CRNA  Anesthesia Plan Comments:        Anesthesia Quick Evaluation

## 2022-06-17 NOTE — H&P (Signed)
Primary Care Physician:  Kathyrn Drown, MD Primary Gastroenterologist:  Dr. Abbey Chatters  Pre-Procedure History & Physical: HPI:  Angela Salas is a 57 y.o. female is here for a colonoscopy to be performed for surveillance purposes, personal history of colon polyps   Past Medical History:  Diagnosis Date   Bilateral groin pain [id number]   Complication of anesthesia    Current use of estrogen therapy 10/17/2012   Hyperlipidemia 11/22/2019   Hypertension    Kidney stone 09/2008   Migraines    PONV (postoperative nausea and vomiting)     Past Surgical History:  Procedure Laterality Date   ABDOMINAL HYSTERECTOMY     COLONOSCOPY N/A 05/20/2016   Procedure: COLONOSCOPY;  Surgeon: Danie Binder, MD;  Location: [facility];  Service: Endoscopy;  Laterality: N/A;  11:30 AM - pt knows to arrive at 10:00   lap lso      Prior to Admission medications   Medication Sig Start Date End Date Taking? Authorizing Provider  estradiol (VIVELLE-DOT) 0.1 MG/24HR patch APPLY 1 PATCH TOPICALLY TO THE SKIN 2 TIMES A WEEK AS DIRECTED 09/13/21  Yes Derrek Monaco A, NP  fexofenadine (ALLEGRA) 180 MG tablet Take 180 mg by mouth daily as needed for allergies.   Yes [provider]  hydrochlorothiazide (HYDRODIURIL) 25 MG tablet TAKE 1 TABLET(25 MG) BY MOUTH EVERY MORNING 10/20/21  Yes Kathyrn Drown, MD  Na Sulfate-K Sulfate-Mg Sulf 17.5-3.13-1.6 GM/177ML SOLN As directed 05/31/22  Yes Brianda Beitler, Elon Alas, DO  Polyethyl Glycol-Propyl Glycol (SYSTANE) 0.4-0.3 % SOLN Place 1-2 drops into both eyes 3 (three) times daily as needed (dry/irritated/allergy eyes.).   Yes [provider]  potassium chloride (KLOR-CON) 10 MEQ tablet 1 qd 10/20/21  Yes Luking, Scott A, MD  rosuvastatin (CRESTOR) 5 MG tablet TAKE 1 TABLET BY MOUTH AT BEDTIME 10/20/21  Yes Luking, Elayne Snare, MD  ALPRAZolam Duanne Moron) 0.5 MG tablet 1/2 to 1 twice daily as needed anxiety caution drowsiness home use only not with driving [id number]    Luking, Nicki Reaper A, MD  ibuprofen (ADVIL) 800 MG tablet TAKE 1 TABLET(800 MG) BY MOUTH EVERY 8 HOURS AS NEEDED FOR PAIN 09/15/21   Kathyrn Drown, MD  tamsulosin (FLOMAX) 0.4 MG CAPS capsule TAKE 1 CAPSULE(0.4 MG) BY MOUTH DAILY Patient taking differently: Take 0.4 mg by mouth daily as needed (kidney stones). 11/10/21   Kathyrn Drown, MD    Allergies as of 05/31/2022 - Review Complete 10/20/2021  Allergen Reaction Noted   Anesthesia s-i-40 [propofol]  01/09/2019   Lexapro [escitalopram] Palpitations 05/13/2017    Family History  Problem Relation Age of Onset   Diabetes Mother    Hypertension Mother    Depression Mother    Other Mother        committed suicide   Diabetes Maternal Grandmother     Social History   Socioeconomic History   Marital status: Married    Spouse name: Not on file   Number of children: Not on file   Years of education: Not on file   Highest education level: Not on file  Occupational History   Not on file  Tobacco Use   Smoking status: Never   Smokeless tobacco: Never  Vaping Use   Vaping Use: Never used  Substance and Sexual Activity   Alcohol use: No    Alcohol/week: 2.0 standard drinks of alcohol    Types: 2 Glasses of wine per week    Comment: occ   Drug use:  No   Sexual activity: Yes    Birth control/protection: Surgical    Comment: hyst  Other Topics Concern   Not on file  Social History Narrative   Not on file   Social Determinants of Health   Financial Resource Strain: Not on file  Food Insecurity: Not on file  Transportation Needs: Not on file  Physical Activity: Not on file  Stress: Not on file  Social Connections: Not on file  Intimate Partner Violence: Not on file    Review of Systems: See HPI, otherwise negative ROS  Physical Exam: Vital signs in last 24 hours: Temp:  [98.4 F (36.9 C)] 98.4 F (36.9 C) (03/08 0724) Pulse Rate:  [72] 72 (03/08 0724) Resp:  [11] 11 (03/08 0724) BP: (127)/(72) 127/72 (03/08  0724) SpO2:  [95 %] 95 % (03/08 0724) Weight:  [68 kg] 68 kg (03/08 0724)   General:   Alert,  Well-developed, well-nourished, pleasant and cooperative in NAD Head:  Normocephalic and atraumatic. Eyes:  Sclera clear, no icterus.   Conjunctiva pink. Ears:  Normal auditory acuity. Nose:  No deformity, discharge,  or lesions. Msk:  Symmetrical without gross deformities. Normal posture. Extremities:  Without clubbing or edema. Neurologic:  Alert and  oriented x4;  grossly normal neurologically. Skin:  Intact without significant lesions or rashes. Psych:  Alert and cooperative. Normal mood and affect.  Impression/Plan: Angela Salas is here for a colonoscopy to be performed for surveillance purposes, personal history of colon polyps   The risks of the procedure including infection, bleed, or perforation as well as benefits, limitations, alternatives and imponderables have been reviewed with the patient. Questions have been answered. All parties agreeable.

## 2022-06-20 LAB — SURGICAL PATHOLOGY

## 2022-06-24 ENCOUNTER — Telehealth: Payer: Self-pay | Admitting: Family Medicine

## 2022-06-24 ENCOUNTER — Other Ambulatory Visit: Payer: Self-pay | Admitting: *Deleted

## 2022-06-24 DIAGNOSIS — G43109 Migraine with aura, not intractable, without status migrainosus: Secondary | ICD-10-CM

## 2022-06-24 MED ORDER — HYDROCHLOROTHIAZIDE 25 MG PO TABS: ORAL_TABLET | ORAL | 0 refills | Status: DC

## 2022-06-24 MED ORDER — POTASSIUM CHLORIDE ER 10 MEQ PO TBCR: EXTENDED_RELEASE_TABLET | ORAL | 0 refills | Status: DC

## 2022-06-24 NOTE — Telephone Encounter (Addendum)
Prescription Request  06/24/2022  LOV: 10/20/2021  What is the name of the medication or equipment? hydrochlorothiazide (HYDRODIURIL) 25 MG tablet and potassium chloride (KLOR-CON) 10 MEQ tablet   Have you contacted your pharmacy to request a refill? Yes   Which pharmacy would you like this sent to?  [facility] [phone] - [place], [place] [id number] [place] [address] Phone: [phone] Fax: [phone]    Patient notified that their request is being sent to the clinical staff for review and that they should receive a response within 2 business days.   Please advise at [facility] [phone]

## 2022-06-28 ENCOUNTER — Encounter (HOSPITAL_COMMUNITY): Payer: Self-pay | Admitting: Internal Medicine

## 2022-07-24 ENCOUNTER — Other Ambulatory Visit: Payer: Self-pay | Admitting: Family Medicine

## 2022-08-23 ENCOUNTER — Other Ambulatory Visit: Payer: Self-pay | Admitting: Family Medicine

## 2022-09-12 ENCOUNTER — Other Ambulatory Visit: Payer: Self-pay | Admitting: Adult Health

## 2022-09-26 IMAGING — MG MM DIGITAL SCREENING BILAT W/ TOMO AND CAD
6 of 10 series · 6 of 30 positions shown · non-contrast
Comparison: Previous exam(s).

CLINICAL DATA: Screening.

EXAM:
DIGITAL SCREENING BILATERAL MAMMOGRAM WITH TOMOSYNTHESIS AND CAD
TECHNIQUE: Bilateral screening digital craniocaudal and mediolateral oblique
mammograms were obtained. Bilateral screening digital breast
tomosynthesis was performed. The images were evaluated with
computer-aided detection.

[L MLO synth-2D (1 of 2)]
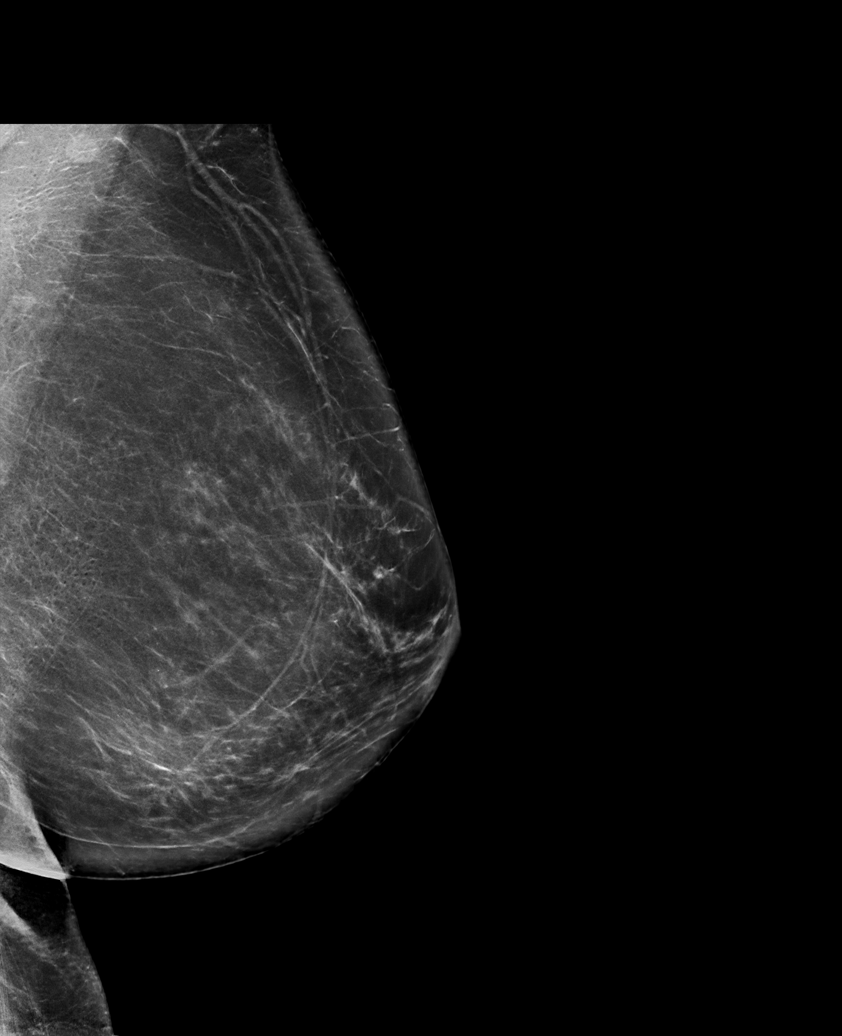

[L MLO synth-2D (2 of 2)]
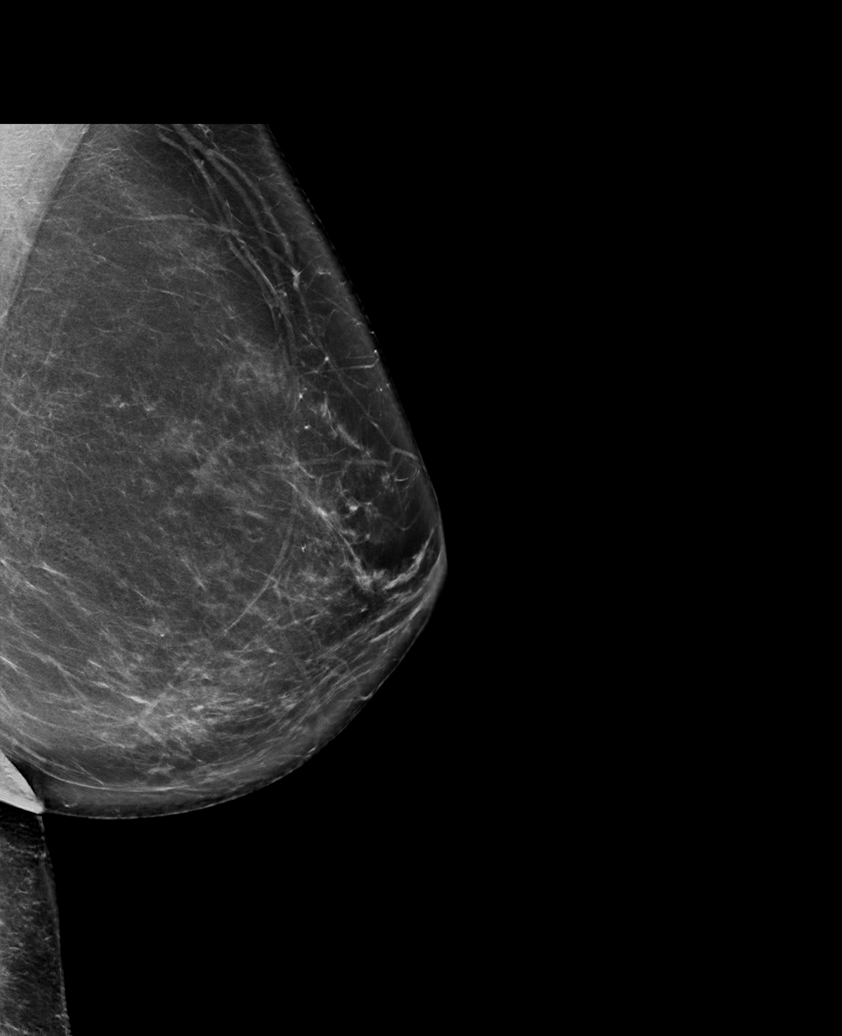

[L CC synth-2D]
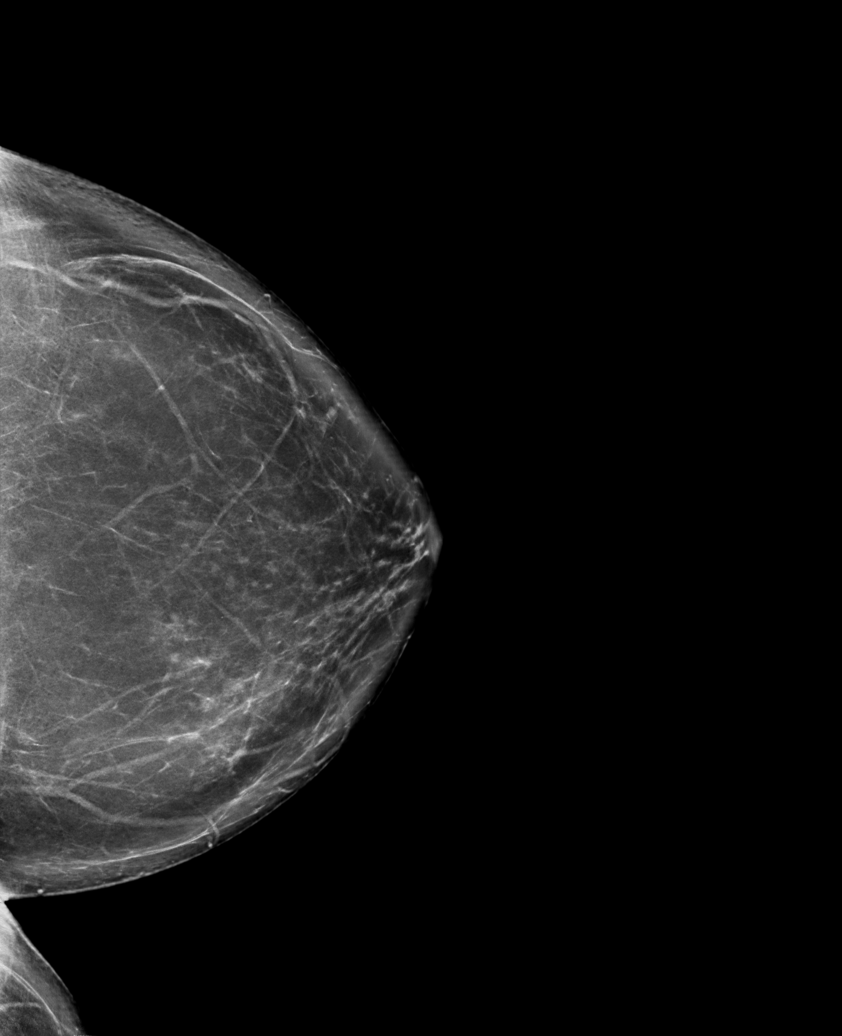

[R CC synth-2D]
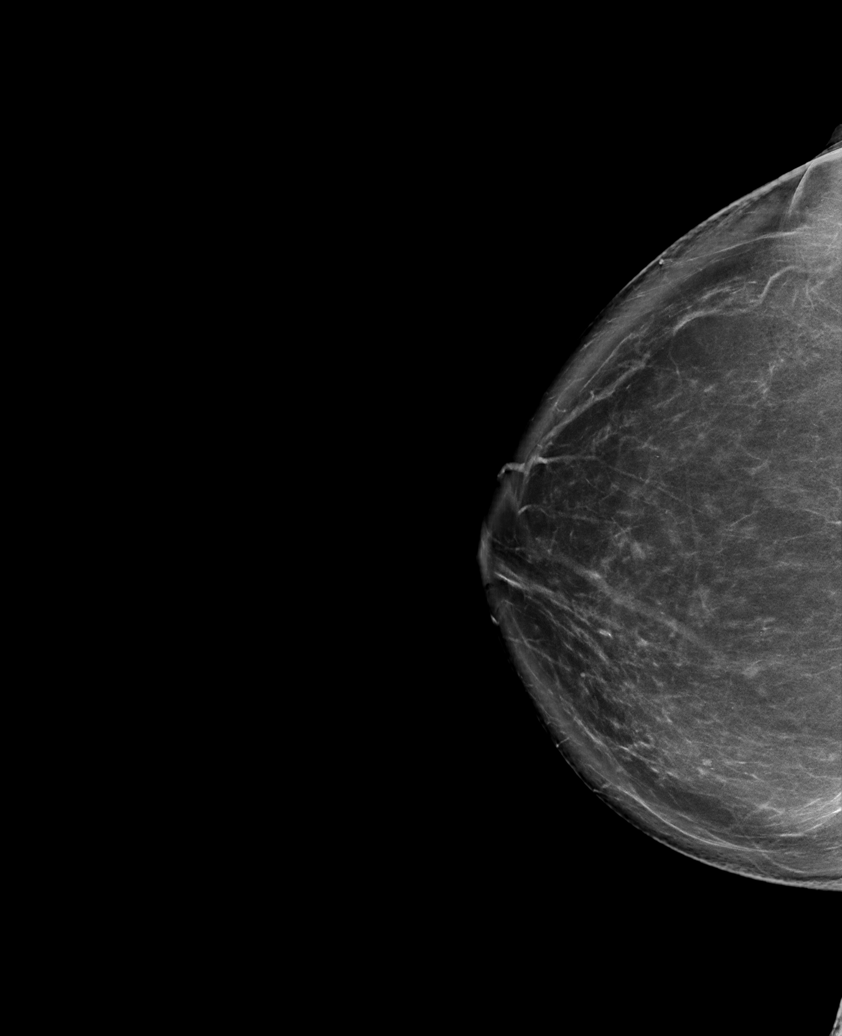

[R MLO synth-2D]
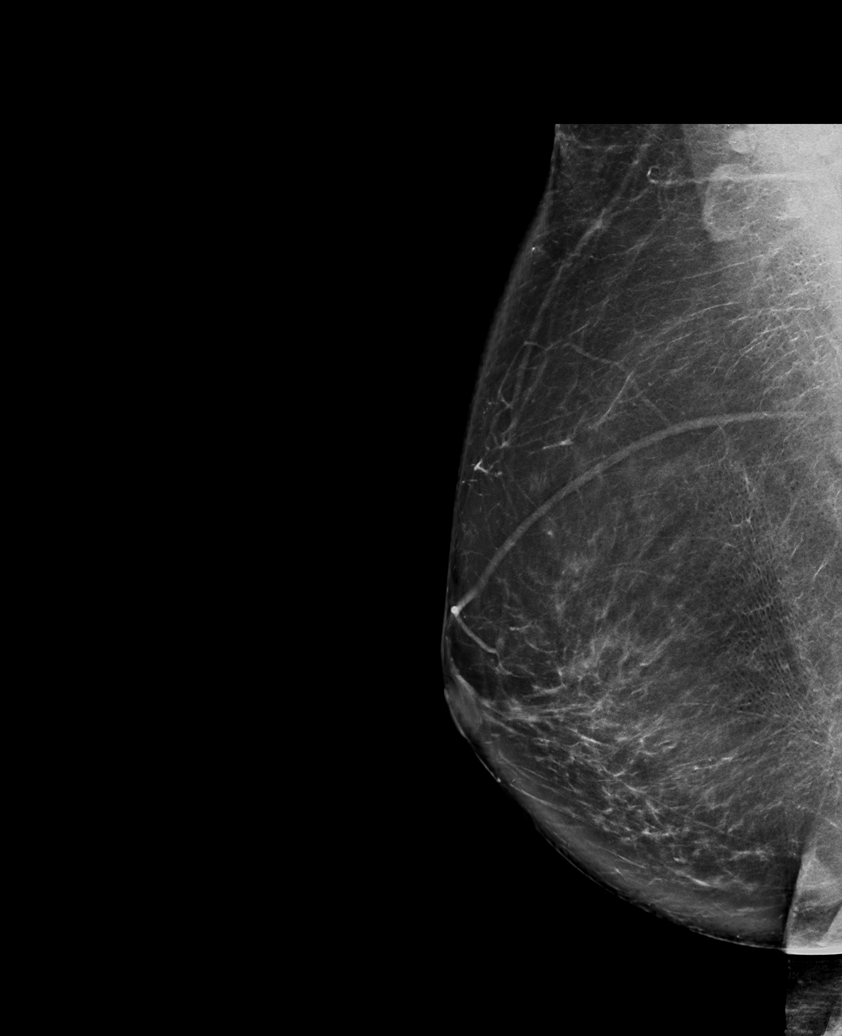

[L MLO tomo · tomo slice 45/89.0]
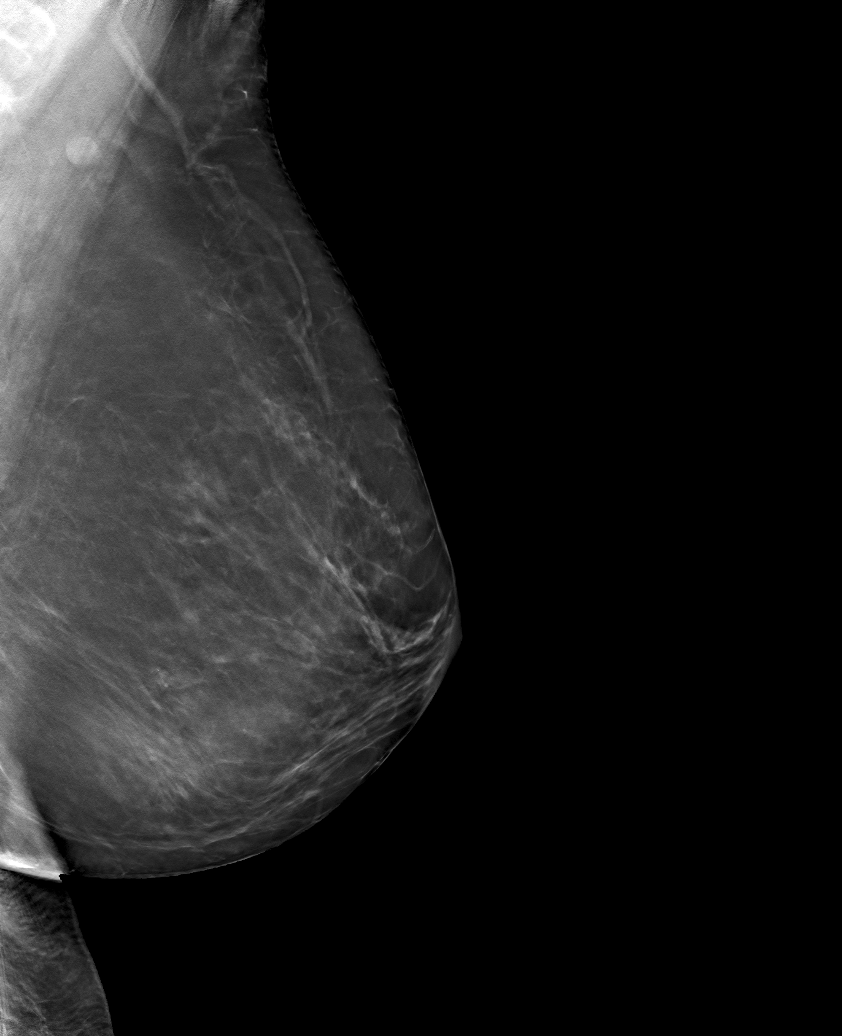

[6 of 30 positions shown; findings below may reference images not displayed]

ACR Breast Density Category b: There are scattered areas of
fibroglandular density.
FINDINGS: There are no findings suspicious for malignancy.
IMPRESSION: No mammographic evidence of malignancy. A result letter of this
screening mammogram will be mailed directly to the patient.

RECOMMENDATION:
Screening mammogram in one year. (Code:51-O-LD2)

BI-RADS CATEGORY  1: Negative.

## 2022-10-05 ENCOUNTER — Encounter: Payer: Self-pay | Admitting: Nurse Practitioner

## 2022-10-05 ENCOUNTER — Ambulatory Visit (INDEPENDENT_AMBULATORY_CARE_PROVIDER_SITE_OTHER): Payer: 59 | Admitting: Nurse Practitioner

## 2022-10-05 VITALS — BP 124/86 | HR 65 | Ht 65.0 in | Wt 154.6 lb

## 2022-10-05 DIAGNOSIS — Z01411 Encounter for gynecological examination (general) (routine) with abnormal findings: Secondary | ICD-10-CM | POA: Diagnosis not present

## 2022-10-05 DIAGNOSIS — Z01419 Encounter for gynecological examination (general) (routine) without abnormal findings: Secondary | ICD-10-CM

## 2022-10-05 DIAGNOSIS — F419 Anxiety disorder, unspecified: Secondary | ICD-10-CM

## 2022-10-05 MED ORDER — SERTRALINE HCL 50 MG PO TABS: ORAL_TABLET | ORAL | 0 refills | Status: DC

## 2022-10-05 NOTE — Patient Instructions (Signed)
Recommend shingles vaccine  Diet for Irritable Bowel Syndrome When you have irritable bowel syndrome (IBS), it is very important to follow the eating habits that are best for your condition. IBS may cause various symptoms, such as pain in the abdomen, constipation, or diarrhea. Choosing the right foods can help to ease the discomfort from these symptoms. Work with your health care provider and dietitian to find the eating plan that will help to control your symptoms. What are tips for following this plan?  Keep a food diary. This will help you identify foods that cause symptoms. Write down: What you eat and when you eat it. What symptoms you have. When symptoms occur in relation to your meals, such as "pain in abdomen 2 hours after dinner." Eat your meals slowly and in a relaxed setting. Aim to eat 5-6 small meals per day. Do not skip meals. Drink enough fluid to keep your urine pale yellow. Ask your health care provider if you should take an over-the-counter probiotic to help restore healthy bacteria in your gut (digestive tract). Probiotics are foods that contain good bacteria and yeasts. Your dietitian may have specific dietary recommendations for you based on your symptoms. Your dietitian may recommend that you: Avoid foods that cause symptoms. Talk with your dietitian about other ways to get the same nutrients that are in those problem foods. Avoid foods with gluten. Gluten is a protein that is found in rye, wheat, and barley. Eat more foods that contain soluble fiber. Examples of foods with high soluble fiber include oats, seeds, and certain fruits and vegetables. Take a fiber supplement if told by your dietitian. Reduce or avoid certain foods called FODMAPs. These are foods that contain sugars that are hard for some people to digest. Ask your health care provider which foods to avoid. What foods should I avoid? The following are some foods and drinks that may make your symptoms  worse: Fatty foods, such as french fries. Foods that contain gluten, such as pasta and cereal. Dairy products, such as milk, cheese, and ice cream. Spicy foods. Alcohol. Products with caffeine, such as coffee, tea, or chocolate. Carbonated drinks, such as soda. Foods that are high in FODMAPs. These include certain fruits and vegetables. Products with sweeteners such as honey, high fructose corn syrup, sorbitol, and mannitol. The items listed above may not be a complete list of foods and beverages you should avoid. Contact a dietitian for more information. What foods are good sources of fiber? Your health care provider or dietitian may recommend that you eat more foods that contain fiber. Fiber can help to reduce constipation and other IBS symptoms. Add foods with fiber to your diet a little at a time so your body can get used to them. Too much fiber at one time might cause gas and swelling of your abdomen. The following are some foods that are good sources of fiber: Berries, such as raspberries, strawberries, and blueberries. Tomatoes. Carrots. Brown rice. Oats. Seeds, such as chia and pumpkin seeds. The items listed above may not be a complete list of recommended sources of fiber. Contact your dietitian for more options. Where to find more information International Foundation for Functional Gastrointestinal Disorders: aboutibs.[employer] of Diabetes and Digestive and Kidney Diseases: StageSync.si Summary When you have irritable bowel syndrome (IBS), it is very important to follow the eating habits that are best for your condition. IBS may cause various symptoms, such as pain in the abdomen, constipation, or diarrhea. Choosing the right foods can  help to ease the discomfort that comes from symptoms. Your health care provider or dietitian may recommend that you eat more foods that contain fiber. Keep a food diary. This will help you identify foods that cause symptoms. This  information is not intended to replace advice given to you by your health care provider. Make sure you discuss any questions you have with your health care provider. Document Revised: 03/09/2021 Document Reviewed: 03/09/2021 Elsevier Patient Education  2024 [employer].
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
## 2022-10-06 ENCOUNTER — Encounter: Payer: Self-pay | Admitting: Nurse Practitioner

## 2022-10-06 LAB — CBC WITH DIFFERENTIAL/PLATELET
Basophils Absolute: 0.1 10*3/uL (ref 0.0–0.2)
Basos: 1 %
EOS (ABSOLUTE): 0.3 10*3/uL (ref 0.0–0.4)
Eos: 5 %
Hematocrit: 42.8 % (ref 34.0–46.6)
Hemoglobin: 14.3 g/dL (ref 11.1–15.9)
Immature Grans (Abs): 0 10*3/uL (ref 0.0–0.1)
Immature Granulocytes: 0 %
Lymphocytes Absolute: 1.6 10*3/uL (ref 0.7–3.1)
Lymphs: 25 %
MCH: 30.8 pg (ref 26.6–33.0)
MCHC: 33.4 g/dL (ref 31.5–35.7)
MCV: 92 fL (ref 79–97)
Monocytes Absolute: 0.5 10*3/uL (ref 0.1–0.9)
Monocytes: 8 %
Neutrophils Absolute: 3.9 10*3/uL (ref 1.4–7.0)
Neutrophils: 61 %
Platelets: 194 10*3/uL (ref 150–450)
RBC: 4.65 x10E6/uL (ref 3.77–5.28)
RDW: 11.8 % (ref 11.7–15.4)
WBC: 6.4 10*3/uL (ref 3.4–10.8)

## 2022-10-06 LAB — COMPREHENSIVE METABOLIC PANEL
ALT: 19 IU/L (ref 0–32)
AST: 23 IU/L (ref 0–40)
Albumin: 4.4 g/dL (ref 3.8–4.9)
Alkaline Phosphatase: 82 IU/L (ref 44–121)
BUN/Creatinine Ratio: 18 (ref 9–23)
BUN: 13 mg/dL (ref 6–24)
Bilirubin Total: 0.5 mg/dL (ref 0.0–1.2)
CO2: 25 mmol/L (ref 20–29)
Calcium: 9.2 mg/dL (ref 8.7–10.2)
Chloride: 101 mmol/L (ref 96–106)
Creatinine, Ser: 0.73 mg/dL (ref 0.57–1.00)
Globulin, Total: 2.1 g/dL (ref 1.5–4.5)
Glucose: 83 mg/dL (ref 70–99)
Potassium: 4.3 mmol/L (ref 3.5–5.2)
Sodium: 138 mmol/L (ref 134–144)
Total Protein: 6.5 g/dL (ref 6.0–8.5)
eGFR: 96 mL/min/{1.73_m2} (ref >59)

## 2022-10-06 LAB — LIPID PANEL
Chol/HDL Ratio: 3 ratio (ref 0.0–4.4)
Cholesterol, Total: 168 mg/dL (ref 100–199)
HDL: 56 mg/dL (ref >39)
LDL Chol Calc (NIH): 97 mg/dL (ref 0–99)
Triglycerides: 77 mg/dL (ref 0–149)
VLDL Cholesterol Cal: 15 mg/dL (ref 5–40)

## 2022-10-06 LAB — TSH: TSH: 1.57 u[IU]/mL (ref 0.450–4.500)

## 2022-11-02 ENCOUNTER — Telehealth: Payer: Self-pay

## 2022-11-02 MED ORDER — SERTRALINE HCL 50 MG PO TABS: ORAL_TABLET | ORAL | 0 refills | Status: DC

## 2022-11-02 NOTE — Telephone Encounter (Signed)
Prescription Request  11/02/2022  LOV: Visit date not found  What is the name of the medication or equipment? sertraline (ZOLOFT) 50 MG tablet   Have you contacted your pharmacy to request a refill? Yes   Which pharmacy would you like this sent to?  [facility] [phone] - [place], [place] - [address] AT [facility] [address] [place] [address] Phone: [phone] Fax: [phone]    Patient notified that their request is being sent to the clinical staff for review and that they should receive a response within 2 business days.   Please advise at Mobile [phone] (mobile)

## 2022-11-11 ENCOUNTER — Telehealth (INDEPENDENT_AMBULATORY_CARE_PROVIDER_SITE_OTHER): Payer: 59 | Admitting: Nurse Practitioner

## 2022-11-11 ENCOUNTER — Encounter: Payer: Self-pay | Admitting: Nurse Practitioner

## 2022-11-11 DIAGNOSIS — F419 Anxiety disorder, unspecified: Secondary | ICD-10-CM | POA: Diagnosis not present

## 2022-11-11 DIAGNOSIS — I10 Essential (primary) hypertension: Secondary | ICD-10-CM

## 2022-11-11 MED ORDER — HYDROCHLOROTHIAZIDE 25 MG PO TABS: ORAL_TABLET | ORAL | 1 refills | Status: DC

## 2022-11-11 MED ORDER — POTASSIUM CHLORIDE ER 10 MEQ PO TBCR: EXTENDED_RELEASE_TABLET | ORAL | 1 refills | Status: DC

## 2022-11-11 NOTE — Progress Notes (Addendum)
Subjective:    Patient ID: Angela Salas, female    DOB: 1966-01-31, 57 y.o.   MRN: [id number]  HPI Ms. Angela Salas, Angela Salas are scheduled for a virtual visit with your provider today.    Just as we do with appointments in the office, we must obtain your consent to participate.  Your consent will be active for this visit and any virtual visit you may have with one of our providers in the next 365 days.    If you have a MyChart account, I can also send a copy of this consent to you electronically.  All virtual visits are billed to your insurance company just like a traditional visit in the office.  As this is a virtual visit, video technology does not allow for your provider to perform a traditional examination.  This may limit your provider's ability to fully assess your condition.  If your provider identifies any concerns that need to be evaluated in person or the need to arrange testing such as labs, EKG, etc, we will make arrangements to do so.    Although advances in technology are sophisticated, we cannot ensure that it will always work on either your end or our end.  If the connection with a video visit is poor, we may have to switch to a telephone visit.  With either a video or telephone visit, we are not always able to ensure that we have a secure connection.   I need to obtain your verbal consent now.   Are you willing to proceed with your visit today?   Angela Salas has provided verbal consent on 11/11/2022 for a virtual visit (video or telephone).  Virtual Visit via Telephone Note  I connected with Angela Salas on 11/11/22 at  9:00 AM EDT by telephone and verified that I am speaking with the correct person using two identifiers.  Location: Patient: work Provider: office   I discussed the limitations, risks, security and privacy concerns of performing an evaluation and management service by telephone and the availability of in person appointments. I also discussed with the patient that  there may be a patient responsible charge related to this service. The patient expressed understanding and agreed to proceed.   History of Present Illness: Presents for recheck anxiety.  Sertraline 50 mg daily is working well for her symptoms.  Denies any adverse effects.  Describes her sleep is "really good".  Has never used her alprazolam but has it on hand just in case she needs it.  Also needs refills on her blood pressure medication.  States her BP is running well outside of the office.  Takes HCTZ and potassium.    11/11/2022    8:52 AM  Depression screen PHQ 2/9  Decreased Interest 0  Down, Depressed, Hopeless 0  PHQ - 2 Score 0  Altered sleeping 0  Tired, decreased energy 0  Change in appetite 0  Feeling bad or failure about yourself  0  Trouble concentrating 0  Moving slowly or fidgety/restless 0  Suicidal thoughts 0  PHQ-9 Score 0  Difficult doing work/chores Not difficult at all      11/11/2022    8:53 AM 10/05/2022    9:44 AM  GAD 7 : Generalized Anxiety Score  Nervous, Anxious, on Edge 0 1  Control/stop worrying 0 1  Worry too much - different things  1  Trouble relaxing 0 0  Restless 1 1  Easily annoyed or irritable 0 0  Afraid - awful  might happen 0 0  Total GAD 7 Score  4  Anxiety Difficulty Not difficult at all      Observations/Objective: Alert, oriented.  Speech clear.  No further exam possible due to phone visit.  Assessment and Plan: Problem List Items Addressed This Visit       Cardiovascular and Mediastinum   Hypertension   Relevant Medications   hydrochlorothiazide (HYDRODIURIL) 25 MG tablet     Other   Anxiety - Primary   Meds ordered this encounter  Medications   hydrochlorothiazide (HYDRODIURIL) 25 MG tablet    Sig: TAKE 1 TABLET(25 MG) BY MOUTH EVERY MORNING    Dispense:  90 tablet    Refill:  1    Order Specific Question:   Supervising Provider    Answer:   Lilyan Punt A [[id number]]   potassium chloride (KLOR-CON) 10 MEQ tablet     Sig: TAKE 1 TABLET BY MOUTH EVERY DAY    Dispense:  90 tablet    Refill:  1    Order Specific Question:   Supervising Provider    Answer:   Lilyan Punt A [[id number]]     Follow Up Instructions: Continue current medication regimen as directed.  Labs up-to-date from 10/05/2022. Recommend flu vaccine this fall. Return in about 6 months (around 05/14/2023). Call back sooner if needed.   I discussed the assessment and treatment plan with the patient. The patient was provided an opportunity to ask questions and all were answered. The patient agreed with the plan and demonstrated an understanding of the instructions.   The patient was advised to call back or seek an in-person evaluation if the symptoms worsen or if the condition fails to improve as anticipated.  I provided 15 minutes of non-face-to-face time during this encounter.   Campbell Riches, NP          Review of Systems     Objective:   Physical Exam        Assessment & Plan:

## 2023-01-04 ENCOUNTER — Other Ambulatory Visit: Payer: Self-pay

## 2023-01-04 MED ORDER — ROSUVASTATIN CALCIUM 5 MG PO TABS: ORAL_TABLET | ORAL | 1 refills | Status: DC

## 2023-01-11 ENCOUNTER — Encounter: Payer: Self-pay | Admitting: Family Medicine

## 2023-01-11 ENCOUNTER — Ambulatory Visit: Payer: 59 | Admitting: Family Medicine

## 2023-01-11 VITALS — BP 110/82 | HR 113 | Temp 98.6°F | Ht 65.0 in | Wt 151.0 lb

## 2023-01-11 DIAGNOSIS — J020 Streptococcal pharyngitis: Secondary | ICD-10-CM | POA: Diagnosis not present

## 2023-01-11 DIAGNOSIS — R509 Fever, unspecified: Secondary | ICD-10-CM | POA: Diagnosis not present

## 2023-01-11 HISTORY — DX: Streptococcal pharyngitis: J02.0

## 2023-01-11 LAB — POCT RAPID STREP A (OFFICE): Rapid Strep A Screen: POSITIVE — AB

## 2023-01-11 MED ORDER — AMOXICILLIN 500 MG PO CAPS: 500.0000 mg | ORAL_CAPSULE | Freq: Two times a day (BID) | ORAL | 0 refills | Status: AC

## 2023-01-11 NOTE — Assessment & Plan Note (Signed)
Acute illness with systemic symptoms.  Rapid strep positive.  Treating with amoxicillin.  Also waiting COVID and flu testing.

## 2023-01-11 NOTE — Progress Notes (Signed)
Subjective:  Patient ID: Angela Salas, female    DOB: [birth year]  Age: 57 y.o. MRN: [id number]  CC: Chief Complaint  Patient presents with   Fever    Alternating tyl/ ibuprofen Sore throat   Generalized Body Aches   left ear pain     HPI:  57 year old female presents for evaluation of the above.  Patient reports that her symptoms started yesterday morning.  She reports fever, Tmax 102.  Associated body aches.  She reports sore throat as well.  She states that she has had some ear discomfort as well.  She has taken Tylenol and ibuprofen.  Took this at this morning.  Currently afebrile.  She states that she is most bothered by body aches.  No known sick contacts.  Patient Active Problem List   Diagnosis Date Noted   Pharyngitis due to Streptococcus species 01/11/2023   Anxiety 10/05/2022   Hyperlipidemia 11/22/2019   Hypertension 10/17/2012   Migraines 10/17/2012   Current use of estrogen therapy 10/17/2012    Social Hx   Social History   Socioeconomic History   Marital status: Married    Spouse name: Not on file   Number of children: Not on file   Years of education: Not on file   Highest education level: Not on file  Occupational History   Not on file  Tobacco Use   Smoking status: Never   Smokeless tobacco: Never  Vaping Use   Vaping status: Never Used  Substance and Sexual Activity   Alcohol use: No    Alcohol/week: 2.0 standard drinks of alcohol    Types: 2 Glasses of wine per week    Comment: occ   Drug use: No   Sexual activity: Yes    Birth control/protection: Surgical    Comment: hyst  Other Topics Concern   Not on file  Social History Narrative   Not on file   Social Determinants of Health   Financial Resource Strain: Not on file  Food Insecurity: Not on file  Transportation Needs: Not on file  Physical Activity: Not on file  Stress: Not on file  Social Connections: Not on file    Review of Systems Per HPI  Objective:  BP 110/82    Pulse (!) 113   Temp 98.6 F (37 C)   Ht 5\' 5"  (1.651 m)   Wt 151 lb (68.5 kg)   SpO2 98%   BMI 25.13 kg/m      01/11/2023    4:16 PM 10/05/2022    9:39 AM 06/17/2022    8:22 AM  BP/Weight  Systolic BP 110 124 90  Diastolic BP 82 86 51  Wt. (Lbs) 151 154.6   BMI 25.13 kg/m2 25.73 kg/m2     Physical Exam Vitals and nursing note reviewed.  Constitutional:      General: She is not in acute distress. HENT:     Head: Normocephalic and atraumatic.     Right Ear: Tympanic membrane normal.     Left Ear: Tympanic membrane normal.     Mouth/Throat:     Comments: Severe oropharyngeal erythema. Cardiovascular:     Rate and Rhythm: Regular rhythm. Tachycardia present.  Pulmonary:     Effort: Pulmonary effort is normal.     Breath sounds: Normal breath sounds. No wheezing or rales.  Musculoskeletal:     Cervical back: Neck supple.  Lymphadenopathy:     Cervical: No cervical adenopathy.  Neurological:     Mental Status: She is  alert.  Psychiatric:        Mood and Affect: Mood normal.        Behavior: Behavior normal.     Lab Results  Component Value Date   WBC 6.4 10/05/2022   HGB 14.3 10/05/2022   HCT 42.8 10/05/2022   PLT 194 10/05/2022   GLUCOSE 83 10/05/2022   CHOL 168 10/05/2022   TRIG 77 10/05/2022   HDL 56 10/05/2022   LDLCALC 97 10/05/2022   ALT 19 10/05/2022   AST 23 10/05/2022   NA 138 10/05/2022   K 4.3 10/05/2022   CL 101 10/05/2022   CREATININE 0.73 10/05/2022   BUN 13 10/05/2022   CO2 25 10/05/2022   TSH 1.570 10/05/2022   INR 1.0 11/20/2019   HGBA1C 4.9 04/25/2013     Assessment & Plan:   Problem List Items Addressed This Visit       Respiratory   Pharyngitis due to Streptococcus species - Primary    Acute illness with systemic symptoms.  Rapid strep positive.  Treating with amoxicillin.  Also waiting COVID and flu testing.      Relevant Orders   POCT rapid strep A (Completed)   Other Visit Diagnoses     Fever, unspecified fever  cause       Relevant Orders   COVID-19, Flu A+B and RSV       Meds ordered this encounter  Medications   amoxicillin (AMOXIL) 500 MG capsule    Sig: Take 1 capsule (500 mg total) by mouth 2 (two) times daily for 10 days.    Dispense:  20 capsule    Refill:  0    Follow-up:  Return if symptoms worsen or fail to improve.  Everlene Other DO [facility]

## 2023-01-11 NOTE — Patient Instructions (Signed)
Antibiotic as prescribed.  Call with concerns.  Take care  Dr. Jamine Highfill  

## 2023-01-13 ENCOUNTER — Other Ambulatory Visit: Payer: Self-pay | Admitting: Family Medicine

## 2023-01-13 ENCOUNTER — Encounter: Payer: Self-pay | Admitting: Family Medicine

## 2023-01-13 LAB — COVID-19, FLU A+B AND RSV
Influenza A, NAA: NOT DETECTED
Influenza B, NAA: NOT DETECTED
RSV, NAA: NOT DETECTED
SARS-CoV-2, NAA: DETECTED — AB

## 2023-01-13 MED ORDER — NIRMATRELVIR/RITONAVIR (PAXLOVID)TABLET: 3.0000 | ORAL_TABLET | Freq: Two times a day (BID) | ORAL | 0 refills | Status: AC

## 2023-01-13 MED ORDER — ALBUTEROL SULFATE HFA 108 (90 BASE) MCG/ACT IN AERS: 2.0000 | INHALATION_SPRAY | Freq: Four times a day (QID) | RESPIRATORY_TRACT | 0 refills | Status: DC | PRN

## 2023-01-13 MED ORDER — PREDNISONE 50 MG PO TABS: 50.0000 mg | ORAL_TABLET | Freq: Every day | ORAL | 0 refills | Status: AC

## 2023-02-03 ENCOUNTER — Telehealth: Payer: Self-pay | Admitting: Family Medicine

## 2023-02-03 ENCOUNTER — Other Ambulatory Visit: Payer: Self-pay | Admitting: Nurse Practitioner

## 2023-02-03 MED ORDER — SERTRALINE HCL 50 MG PO TABS: ORAL_TABLET | ORAL | 0 refills | Status: DC

## 2023-02-03 NOTE — Telephone Encounter (Signed)
Refill on sertraline (ZOLOFT) 50 MG tablet  send to [facility] [address]

## 2023-02-06 ENCOUNTER — Other Ambulatory Visit: Payer: Self-pay | Admitting: Family Medicine

## 2023-04-25 ENCOUNTER — Other Ambulatory Visit: Payer: Self-pay | Admitting: Family Medicine

## 2023-04-25 DIAGNOSIS — Z1231 Encounter for screening mammogram for malignant neoplasm of breast: Secondary | ICD-10-CM

## 2023-05-17 ENCOUNTER — Telehealth: Payer: Self-pay | Admitting: Family Medicine

## 2023-05-17 NOTE — Telephone Encounter (Signed)
 Refill on sertraline (ZOLOFT) 50 MG tablet  send to [facility] [address]

## 2023-05-18 NOTE — Telephone Encounter (Signed)
 May have 90-day with 1 refill will need to do follow-up office visit by July

## 2023-05-19 MED ORDER — SERTRALINE HCL 50 MG PO TABS: ORAL_TABLET | ORAL | 1 refills | Status: DC

## 2023-05-23 ENCOUNTER — Telehealth: Payer: 59 | Admitting: Family Medicine

## 2023-05-23 ENCOUNTER — Ambulatory Visit: Payer: Self-pay

## 2023-05-23 DIAGNOSIS — J019 Acute sinusitis, unspecified: Secondary | ICD-10-CM

## 2023-05-23 MED ORDER — AMOXICILLIN 500 MG PO TABS
500.0000 mg | ORAL_TABLET | Freq: Three times a day (TID) | ORAL | 1 refills | Status: DC
Start: 2023-05-23 — End: 2023-08-30

## 2023-05-23 MED ORDER — ALBUTEROL SULFATE HFA 108 (90 BASE) MCG/ACT IN AERS: 2.0000 | INHALATION_SPRAY | Freq: Four times a day (QID) | RESPIRATORY_TRACT | 1 refills | Status: AC | PRN

## 2023-05-23 MED ORDER — HYDROCODONE BIT-HOMATROP MBR 5-1.5 MG/5ML PO SOLN: ORAL | 0 refills | Status: DC

## 2023-05-23 MED ORDER — PREDNISONE 20 MG PO TABS: ORAL_TABLET | ORAL | 0 refills | Status: DC

## 2023-05-23 NOTE — Progress Notes (Signed)
   Subjective:    Patient ID: Angela Salas, female    DOB: 06-Aug-1965, 58 y.o.   MRN: [id number]  HPI Virtual Visit via Video Note  I connected with Angela Salas on 05/23/23 at  4:10 PM EST by a video enabled telemedicine application and verified that I am speaking with the correct person using two identifiers.  Location: Patient: Home Provider: Office   I discussed the limitations of evaluation and management by telemedicine and the availability of in person appointments. The patient expressed understanding and agreed to proceed.  History of Present Illness:    Observations/Objective:   Assessment and Plan:   Follow Up Instructions:    I discussed the assessment and treatment plan with the patient. The patient was provided an opportunity to ask questions and all were answered. The patient agreed with the plan and demonstrated an understanding of the instructions.   The patient was advised to call back or seek an in-person evaluation if the symptoms worsen or if the condition fails to improve as anticipated.  I provided 15 minutes of non-face-to-face time during this encounter.   Lilyan Punt, MD   Patient presents today with respiratory illness Number of days present-3 to 4 days  Symptoms include-head congestion drainage coughing some chest congestion no high fever no bodyaches no nausea or vomiting, some intermittent wheezing frequent coughing with deep breath no sweats or chills  Presence of worrisome signs (severe shortness of breath, lethargy, etc.) -denies respiratory distress  Recent/current visit to urgent care or ER-none  Recent direct exposure to Covid-none  Any current Covid testing-none    Review of Systems     Objective:   Physical Exam Patient had virtual visit-video Appears to be in no distress Atraumatic Neuro able to relate and oriented No apparent resp distress Color normal        Assessment & Plan:  Respiratory  illness Acute rhinosinusitis with chest congestion Prednisone low-dose 5 days Albuterol 2 puffs every 4 as needed Hycodan nighttime to help with cough Antibiotics as prescribed If progressive symptoms are worse recommend office visit to listen to lungs

## 2023-06-01 ENCOUNTER — Ambulatory Visit: Payer: 59

## 2023-06-06 ENCOUNTER — Encounter: Payer: Self-pay | Admitting: Family Medicine

## 2023-06-07 ENCOUNTER — Other Ambulatory Visit: Payer: Self-pay

## 2023-06-07 MED ORDER — FLUCONAZOLE 150 MG PO TABS: 150.0000 mg | ORAL_TABLET | Freq: Every day | ORAL | 1 refills | Status: DC

## 2023-06-07 NOTE — Telephone Encounter (Signed)
 Nurses Please go ahead and send in Diflucan 150 mg 1 tablet p.o. x 1 Typically that will take care of of yeast infection Yeast infections are fairly common after being on antibiotics May have 1 additional refill if the single tablet does not eliminate the issue she may repeat the medication in 1 week  As for the intermittent shortness of breath if that does not improve I would recommend a follow-up office visit with myself or with one of our assisted providers to have [place] take a listen to the lungs  Please let her know to follow-up if ongoing troubles thanks  [facility] she gets better soon

## 2023-06-10 ENCOUNTER — Ambulatory Visit
Admission: RE | Admit: 2023-06-10 | Discharge: 2023-06-10 | Disposition: A | Discharge status: Home / Self Care | Payer: Self-pay | Source: Ambulatory Visit | Attending: Nurse Practitioner | Admitting: Nurse Practitioner

## 2023-06-10 ENCOUNTER — Telehealth: Payer: Self-pay | Admitting: Nurse Practitioner

## 2023-06-10 ENCOUNTER — Ambulatory Visit (INDEPENDENT_AMBULATORY_CARE_PROVIDER_SITE_OTHER)

## 2023-06-10 VITALS — BP 113/79 | HR 87 | Temp 97.8°F | Resp 20

## 2023-06-10 DIAGNOSIS — R059 Cough, unspecified: Secondary | ICD-10-CM

## 2023-06-10 DIAGNOSIS — R0602 Shortness of breath: Secondary | ICD-10-CM | POA: Diagnosis not present

## 2023-06-10 MED ORDER — PREDNISONE 50 MG PO TABS
ORAL_TABLET | ORAL | 0 refills | Status: DC
Start: 2023-06-10 — End: 2023-08-30

## 2023-06-10 NOTE — Discharge Instructions (Addendum)
 Chest x-ray is pending.  You will be contacted when the results of the chest x-ray are received.  Also, additional treatment will be provided based on your chest x-ray results.  You will also have access to the results via MyChart. Take medication as prescribed.  Continue using the albuterol inhaler and the cough syrup you are currently taking. Make sure you are drinking plenty of fluids and getting plenty of rest. Continue using a humidifier at nighttime during sleep and sleeping elevated on pillows while symptoms persist. As discussed, a cough can be persistent for several days to weeks.  If you develop new fever, wheezing, or other concerns, please follow-up in this clinic or with your primary care physician for further evaluation. Follow-up as needed.

## 2023-06-10 NOTE — ED Triage Notes (Signed)
 Pt reports she still has a bad cough  and SOB after having rhinosinusitis x 2 weeks ago.  Took all the steroids and antibiotics from PCP  but no relief.

## 2023-06-10 NOTE — Telephone Encounter (Signed)
 Call patient to discuss chest x-ray results.  Verified patient with 2 patient identifiers.  Patient was advised that chest x-ray was negative for pneumonia or other active cardiopulmonary disease.  Patient was advised that we will proceed with current plan of care, discussed indications for follow-up.  Patient was in agreement with this plan of care and verbalized understanding.  All questions were answered.

## 2023-06-10 NOTE — ED Provider Notes (Signed)
 [facility]    CSN: [id number] Arrival date & time: 06/10/23  0901      History   Chief Complaint Chief Complaint  Patient presents with   Cough    Has respiratory issue almost two weeks ago. I went to my primary dr Lilyan Punt. I'm short of breath and having bad coughing spells. - Entered by patient    HPI Angela Salas is a 58 y.o. female.   The history is provided by the patient.   Patient presents for complaints of continued cough with intermittent shortness of breath.  Patient reports she was treated for rhinosinusitis approximately 2 weeks ago by her PCP.  She was prescribed amoxicillin, Hycodan cough syrup, and prednisone.  Patient states that the cough has continued to linger.  She states that she has now experienced some shortness of breath.  States symptoms worsened towards the end of today.  Denies any fever, chills, headache, ear pain, wheezing, difficulty breathing, chest pain, abdominal pain, nausea, vomiting, diarrhea, or rash.   Past Medical History:  Diagnosis Date   Bilateral groin pain 07/14/2015   Complication of anesthesia    Current use of estrogen therapy 10/17/2012   Hyperlipidemia 11/22/2019   Hypertension    Kidney stone 09/2008   Migraines    PONV (postoperative nausea and vomiting)     Patient Active Problem List   Diagnosis Date Noted   Pharyngitis due to Streptococcus species 01/11/2023   Anxiety 10/05/2022   Hyperlipidemia 11/22/2019   Hypertension 10/17/2012   Migraines 10/17/2012   Current use of estrogen therapy 10/17/2012    Past Surgical History:  Procedure Laterality Date   ABDOMINAL HYSTERECTOMY     COLONOSCOPY N/A 05/20/2016   Procedure: COLONOSCOPY;  Surgeon: West Bali, MD;  Location: [facility];  Service: Endoscopy;  Laterality: N/A;  11:30 AM - pt knows to arrive at 10:00   COLONOSCOPY WITH PROPOFOL N/A 06/17/2022   Procedure: COLONOSCOPY WITH PROPOFOL;  Surgeon: Lanelle Bal, DO;  Location: [facility];   Service: Endoscopy;  Laterality: N/A;  8;00 am, asa 2   lap lso     POLYPECTOMY  06/17/2022   Procedure: POLYPECTOMY;  Surgeon: Lanelle Bal, DO;  Location: [facility];  Service: Endoscopy;;    OB History     Gravida  2   Para  2   Term      Preterm      AB      Living  2      SAB      IAB      Ectopic      Multiple      Live Births  2            Home Medications    Prior to Admission medications   Medication Sig Start Date End Date Taking? Authorizing Provider  fluconazole (DIFLUCAN) 150 MG tablet Take 1 tablet (150 mg total) by mouth daily. 06/07/23   Babs [name], MD  predniSONE (DELTASONE) 50 MG tablet Take 1 tablet by mouth daily with breakfast for the next 5 days. 06/10/23  Yes Leath-Warren, Sadie Haber, NP  albuterol (VENTOLIN HFA) 108 (90 Base) MCG/ACT inhaler Inhale 2 puffs into the lungs every 6 (six) hours as needed for wheezing or shortness of breath. 05/23/23   Babs [name], MD  ALPRAZolam Prudy Feeler) 0.5 MG tablet 1/2 to 1 twice daily as needed anxiety caution drowsiness home use only not with driving 81/19/14  Babs [name], MD  amoxicillin (AMOXIL) 500 MG tablet Take 1 tablet (500 mg total) by mouth 3 (three) times daily. 05/23/23   Babs [name], MD  estradiol (VIVELLE-DOT) 0.1 MG/24HR patch APPLY 1 PATCH TOPICALLY TO THE SKIN 2 TIMES A WEEK AS DIRECTED 09/12/22   Adline Potter, NP  fexofenadine (ALLEGRA) 180 MG tablet Take 180 mg by mouth daily as needed for allergies.    [provider]  hydrochlorothiazide (HYDRODIURIL) 25 MG tablet TAKE 1 TABLET(25 MG) BY MOUTH EVERY MORNING 11/11/22   Campbell Riches, NP  HYDROcodone bit-homatropine (HYCODAN) 5-1.5 MG/5ML syrup 5 mL every 6 hours as needed cough for evening and night use only caution drowsiness 05/23/23   Luking, Jonna Coup, MD  Polyethyl Glycol-Propyl Glycol (SYSTANE) 0.4-0.3 % SOLN Place 1-2 drops into both eyes 3 (three) times daily as needed (dry/irritated/allergy eyes.).     [provider]  potassium chloride (KLOR-CON) 10 MEQ tablet TAKE 1 TABLET BY MOUTH EVERY DAY 11/11/22   Campbell Riches, NP  rosuvastatin (CRESTOR) 5 MG tablet TAKE 1 TABLET BY MOUTH AT BEDTIME 01/04/23   Babs [name], MD  sertraline (ZOLOFT) 50 MG tablet Take one tab po qd 05/19/23   Babs [name], MD    Family History Family History  Problem Relation Age of Onset   Diabetes Mother    Hypertension Mother    Depression Mother    Other Mother        committed suicide   Diabetes Maternal Grandmother     Social History Social History   Tobacco Use   Smoking status: Never   Smokeless tobacco: Never  Vaping Use   Vaping status: Never Used  Substance Use Topics   Alcohol use: No    Alcohol/week: 2.0 standard drinks of alcohol    Types: 2 Glasses of wine per week    Comment: occ   Drug use: No     Allergies   Anesthesia s-i-40 [propofol] and Lexapro [escitalopram]   Review of Systems Review of Systems Per HPI  Physical Exam Triage Vital Signs ED Triage Vitals  Encounter Vitals Group     BP 06/10/23 0917 113/79     Systolic BP Percentile --      Diastolic BP Percentile --      Pulse Rate 06/10/23 0917 87     Resp 06/10/23 0917 20     Temp 06/10/23 0917 97.8 F (36.6 C)     Temp Source 06/10/23 0917 Oral     SpO2 06/10/23 0917 96 %     Weight --      Height --      Head Circumference --      Peak Flow --      Pain Score 06/10/23 0918 5     Pain Loc --      Pain Education --      Exclude from Growth Chart --    No data found.  Updated Vital Signs BP 113/79 (BP Location: Right Arm)   Pulse 87   Temp 97.8 F (36.6 C) (Oral)   Resp 20   SpO2 96%   Visual Acuity Right Eye Distance:   Left Eye Distance:   Bilateral Distance:    Right Eye Near:   Left Eye Near:    Bilateral Near:     Physical Exam Vitals and nursing note reviewed.  Constitutional:      General: She is not in acute distress.    Appearance:  Normal appearance.  HENT:      Head: Normocephalic.     Right Ear: Tympanic membrane, ear canal and external ear normal.     Left Ear: Tympanic membrane, ear canal and external ear normal.     Nose: Nose normal.     Mouth/Throat:     Lips: Pink.     Mouth: Mucous membranes are moist.     Pharynx: Oropharynx is clear. Uvula midline. Postnasal drip present. No pharyngeal swelling, oropharyngeal exudate, posterior oropharyngeal erythema or uvula swelling.     Comments: Cobblestoning present to posterior oropharynx  Eyes:     Extraocular Movements: Extraocular movements intact.     Conjunctiva/sclera: Conjunctivae normal.     Pupils: Pupils are equal, round, and reactive to light.  Cardiovascular:     Rate and Rhythm: Normal rate and regular rhythm.     Pulses: Normal pulses.     Heart sounds: Normal heart sounds.  Pulmonary:     Effort: Pulmonary effort is normal. No respiratory distress.     Breath sounds: Normal breath sounds. No stridor. No wheezing, rhonchi or rales.  Abdominal:     General: Bowel sounds are normal.     Palpations: Abdomen is soft.     Tenderness: There is no abdominal tenderness.  Musculoskeletal:     Cervical back: Normal range of motion.  Lymphadenopathy:     Cervical: No cervical adenopathy.  Skin:    General: Skin is warm and dry.  Neurological:     General: No focal deficit present.     Mental Status: She is alert and oriented to person, place, and time.  Psychiatric:        Mood and Affect: Mood normal.        Behavior: Behavior normal.      UC Treatments / Results  Labs (all labs ordered are listed, but only abnormal results are displayed) Labs Reviewed - No data to display  EKG   Radiology No results found.  Procedures Procedures (including critical care time)  Medications Ordered in UC Medications - No data to display  Initial Impression / Assessment and Plan / UC Course  I have reviewed the triage vital signs and the nursing notes.  Pertinent labs &  imaging results that were available during my care of the patient were reviewed by me and considered in my medical decision making (see chart for details).  On exam, lung sounds are clear throughout, room air sats at 96%.  Chest x-ray is pending.  Symptoms consistent with acute bronchitis given the persistency of the cough.  Patient has been treated with amoxicillin previously, will rule out pneumonia with chest x-ray result..  Will treat with prednisone 50 mg for the next 5 days.  Patient advised to continue albuterol inhaler she was previously prescribed, along with the cough medication.  Discussion with patient regarding persistent cough.  Supportive care recommendations were provided and discussed with the patient to include fluids, rest, and over-the-counter analgesics.  Discussed indications regarding follow-up.  Patient was in agreement with this plan of care and verbalized understanding.  All questions were answered.  Patient stable for discharge.  Final Clinical Impressions(s) / UC Diagnoses   Final diagnoses:  Shortness of breath  Cough, unspecified type     Discharge Instructions      Chest x-ray is pending.  You will be contacted when the results of the chest x-ray are received.  Also, additional treatment will be provided based on your chest x-ray results.  You will also have access to the results via MyChart. Take medication as prescribed.  Continue using the albuterol inhaler and the cough syrup you are currently taking. Make sure you are drinking plenty of fluids and getting plenty of rest. Continue using a humidifier at nighttime during sleep and sleeping elevated on pillows while symptoms persist. As discussed, a cough can be persistent for several days to weeks.  If you develop new fever, wheezing, or other concerns, please follow-up in this clinic or with your primary care physician for further evaluation. Follow-up as needed.      ED Prescriptions     Medication Sig  Dispense Auth. Provider   predniSONE (DELTASONE) 50 MG tablet Take 1 tablet by mouth daily with breakfast for the next 5 days. 5 tablet Leath-Warren, Sadie Haber, NP      PDMP not reviewed this encounter.   Abran Cantor, NP 06/10/23 1030

## 2023-06-22 ENCOUNTER — Ambulatory Visit
Admission: RE | Admit: 2023-06-22 | Discharge: 2023-06-22 | Disposition: A | Discharge status: Home / Self Care | Payer: 59 | Source: Ambulatory Visit | Attending: Family Medicine | Admitting: Family Medicine

## 2023-06-22 DIAGNOSIS — Z1231 Encounter for screening mammogram for malignant neoplasm of breast: Secondary | ICD-10-CM

## 2023-06-27 ENCOUNTER — Other Ambulatory Visit: Payer: Self-pay | Admitting: Family Medicine

## 2023-06-27 DIAGNOSIS — R928 Other abnormal and inconclusive findings on diagnostic imaging of breast: Secondary | ICD-10-CM

## 2023-07-08 ENCOUNTER — Other Ambulatory Visit: Payer: Self-pay | Admitting: Family Medicine

## 2023-07-08 ENCOUNTER — Ambulatory Visit
Admission: RE | Admit: 2023-07-08 | Discharge: 2023-07-08 | Disposition: A | Discharge status: Home / Self Care | Source: Ambulatory Visit | Attending: Family Medicine | Admitting: Family Medicine

## 2023-07-08 DIAGNOSIS — R928 Other abnormal and inconclusive findings on diagnostic imaging of breast: Secondary | ICD-10-CM

## 2023-07-08 DIAGNOSIS — M7989 Other specified soft tissue disorders: Secondary | ICD-10-CM

## 2023-07-08 DIAGNOSIS — N6320 Unspecified lump in the left breast, unspecified quadrant: Secondary | ICD-10-CM

## 2023-08-30 ENCOUNTER — Ambulatory Visit: Admitting: Family Medicine

## 2023-08-30 ENCOUNTER — Encounter: Payer: Self-pay | Admitting: Family Medicine

## 2023-08-30 VITALS — BP 118/74 | HR 65 | Temp 97.3°F | Ht 65.0 in | Wt 153.0 lb

## 2023-08-30 DIAGNOSIS — I10 Essential (primary) hypertension: Secondary | ICD-10-CM

## 2023-08-30 DIAGNOSIS — Z0001 Encounter for general adult medical examination with abnormal findings: Secondary | ICD-10-CM | POA: Diagnosis not present

## 2023-08-30 DIAGNOSIS — E785 Hyperlipidemia, unspecified: Secondary | ICD-10-CM | POA: Diagnosis not present

## 2023-08-30 DIAGNOSIS — Z0000 Encounter for general adult medical examination without abnormal findings: Secondary | ICD-10-CM

## 2023-08-30 DIAGNOSIS — Z79899 Other long term (current) drug therapy: Secondary | ICD-10-CM

## 2023-08-30 MED ORDER — SERTRALINE HCL 50 MG PO TABS: ORAL_TABLET | ORAL | 1 refills | Status: DC

## 2023-08-30 MED ORDER — POTASSIUM CHLORIDE ER 10 MEQ PO TBCR: EXTENDED_RELEASE_TABLET | ORAL | 1 refills | Status: DC

## 2023-08-30 MED ORDER — ROSUVASTATIN CALCIUM 5 MG PO TABS: ORAL_TABLET | ORAL | 1 refills | Status: DC

## 2023-08-30 MED ORDER — HYDROCHLOROTHIAZIDE 25 MG PO TABS: ORAL_TABLET | ORAL | 1 refills | Status: DC

## 2023-08-30 NOTE — Progress Notes (Signed)
 Subjective:    Patient ID: Angela Salas, female    DOB: February 15, 1966, 58 y.o.   MRN: [id number]  HPI  Hypertension follow up - refills  Discussed the use of AI scribe software for clinical note transcription with the patient, who gave verbal consent to proceed.  History of Present Illness   Angela MORAIS "Amy" is a 58 year old female who presents for a wellness visit and routine health maintenance.  She has been maintaining a healthy lifestyle through diet and physical activities such as walking and strength training at home. Despite these efforts, she finds it challenging to lose weight. She experiences shortness of breath when walking uphill, which causes her to slow down, and she notices a slight increase in heart rate during these episodes. No chest pressure or tightness is present.  A recent mammogram showed some asymmetry, which was deemed likely benign, and she is scheduled for a follow-up in August. She had a colonoscopy last year where a biopsy was performed on a polyp, which showed no signs of cancer or atypical cells. There is no known family history of colon cancer.  Her current medications include an estradiol  patch, chlorothiazide, potassium, and a cholesterol medication taken at night. She no longer uses alprazolam  and had previously used Diflucan  for a yeast infection. She also takes sertraline  (Zoloft ) at night. Her stress levels and mood have been stable. She has not visited a gynecologist since her hysterectomy. She keeps up with her mammograms and cholesterol monitoring.      Review of Systems     Objective:   Physical Exam General-in no acute distress Eyes-no discharge Lungs-respiratory rate normal, CTA CV-no murmurs,RRR Extremities skin warm dry no edema Neuro grossly normal Behavior normal, alert        Assessment & Plan:   Assessment and Plan    Shortness of breath on exertion Likely related to conditioning. Heart rate increases slightly during  episodes. - Encourage regular physical activity, including walking and strength training.  Use of hydrochlorothiazide  Blood pressure well-controlled at 112/78 mmHg. - Continue hydrochlorothiazide  as prescribed.  Use of potassium supplement Used with hydrochlorothiazide  to maintain electrolyte balance. - Continue potassium supplement as prescribed.  Use of cholesterol medication Previous LDL and HDL levels within target range. Upcoming biometric screening will include updated lipid panel. - Continue cholesterol medication as prescribed. - Order updated lipid panel.  Use of sertraline  Mood is stable. - Continue sertraline  as prescribed.  Use of estradiol  patch Used for hormone replacement therapy. - Continue estradiol  patch as prescribed.      1. Well adult exam (Primary) Adult wellness-complete.wellness physical was conducted today. Importance of diet and exercise were discussed in detail.  Importance of stress reduction and healthy living were discussed.  In addition to this a discussion regarding safety was also covered.  We also reviewed over immunizations and gave recommendations regarding current immunization needed for age.   In addition to this additional areas were also touched on including: Preventative health exams needed:  Colonoscopy 2034  Patient was advised yearly wellness exam  - Lipid Panel - Hepatic Function Panel - Basic Metabolic Panel - CBC with Differential - Microalbumin/Creatinine Ratio, Urine  2. Primary hypertension Continue medication numbers look good check labs - Microalbumin/Creatinine Ratio, Urine  3. Hyperlipidemia, unspecified hyperlipidemia type Healthy diet check labs continue med - Lipid Panel  4. High risk medication use Continue med - Hepatic Function Panel Moods are doing well  Pink patient will be getting mammogram ultrasound  follow-up as planned later this year

## 2023-09-08 ENCOUNTER — Ambulatory Visit: Payer: Self-pay | Admitting: Family Medicine

## 2023-09-08 LAB — BASIC METABOLIC PANEL WITH GFR
BUN/Creatinine Ratio: 22 (ref 9–23)
BUN: 17 mg/dL (ref 6–24)
CO2: 20 mmol/L (ref 20–29)
Calcium: 9.2 mg/dL (ref 8.7–10.2)
Chloride: 100 mmol/L (ref 96–106)
Creatinine, Ser: 0.79 mg/dL (ref 0.57–1.00)
Glucose: 84 mg/dL (ref 70–99)
Potassium: 4.3 mmol/L (ref 3.5–5.2)
Sodium: 139 mmol/L (ref 134–144)
eGFR: 87 mL/min/{1.73_m2} (ref >59)

## 2023-09-08 LAB — CBC WITH DIFFERENTIAL/PLATELET
Basophils Absolute: 0.1 10*3/uL (ref 0.0–0.2)
Basos: 1 %
EOS (ABSOLUTE): 0.4 10*3/uL (ref 0.0–0.4)
Eos: 6 %
Hematocrit: 44.5 % (ref 34.0–46.6)
Hemoglobin: 14.6 g/dL (ref 11.1–15.9)
Immature Grans (Abs): 0 10*3/uL (ref 0.0–0.1)
Immature Granulocytes: 0 %
Lymphocytes Absolute: 1.4 10*3/uL (ref 0.7–3.1)
Lymphs: 23 %
MCH: 31.4 pg (ref 26.6–33.0)
MCHC: 32.8 g/dL (ref 31.5–35.7)
MCV: 96 fL (ref 79–97)
Monocytes Absolute: 0.6 10*3/uL (ref 0.1–0.9)
Monocytes: 9 %
Neutrophils Absolute: 3.8 10*3/uL (ref 1.4–7.0)
Neutrophils: 61 %
Platelets: 193 10*3/uL (ref 150–450)
RBC: 4.65 x10E6/uL (ref 3.77–5.28)
RDW: 12.2 % (ref 11.7–15.4)
WBC: 6.2 10*3/uL (ref 3.4–10.8)

## 2023-09-08 LAB — LIPID PANEL
Chol/HDL Ratio: 2.8 ratio (ref 0.0–4.4)
Cholesterol, Total: 167 mg/dL (ref 100–199)
HDL: 59 mg/dL (ref >39)
LDL Chol Calc (NIH): 93 mg/dL (ref 0–99)
Triglycerides: 82 mg/dL (ref 0–149)
VLDL Cholesterol Cal: 15 mg/dL (ref 5–40)

## 2023-09-08 LAB — MICROALBUMIN / CREATININE URINE RATIO
Creatinine, Urine: 147.3 mg/dL
Microalb/Creat Ratio: 3 mg/g{creat} (ref 0–29)
Microalbumin, Urine: 4.4 ug/mL

## 2023-09-08 LAB — HEPATIC FUNCTION PANEL
ALT: 30 IU/L (ref 0–32)
AST: 28 IU/L (ref 0–40)
Albumin: 4.4 g/dL (ref 3.8–4.9)
Alkaline Phosphatase: 85 IU/L (ref 44–121)
Bilirubin Total: 0.5 mg/dL (ref 0.0–1.2)
Bilirubin, Direct: 0.17 mg/dL (ref 0.00–0.40)
Total Protein: 6.5 g/dL (ref 6.0–8.5)

## 2023-12-04 ENCOUNTER — Encounter: Payer: Self-pay | Admitting: Nurse Practitioner

## 2023-12-06 ENCOUNTER — Other Ambulatory Visit: Payer: Self-pay | Admitting: Nurse Practitioner

## 2023-12-06 MED ORDER — TRIAMCINOLONE ACETONIDE 0.1 % EX CREA: 1.0000 | TOPICAL_CREAM | Freq: Two times a day (BID) | CUTANEOUS | 0 refills | Status: AC

## 2023-12-13 ENCOUNTER — Ambulatory Visit: Payer: Self-pay

## 2023-12-13 ENCOUNTER — Ambulatory Visit: Admitting: Family Medicine

## 2023-12-13 VITALS — BP 109/73 | HR 65 | Temp 97.2°F | Ht 65.0 in | Wt 154.0 lb

## 2023-12-13 DIAGNOSIS — M542 Cervicalgia: Secondary | ICD-10-CM | POA: Diagnosis not present

## 2023-12-13 DIAGNOSIS — M545 Low back pain, unspecified: Secondary | ICD-10-CM

## 2023-12-13 NOTE — Progress Notes (Signed)
   Subjective:    Patient ID: Alan DELENA Gall, female    DOB: [birth year], 58 y.o.   MRN: [id number]  HPI Hip pain  Neck pain  X's 3-4 weeks no injury  Discussed the use of AI scribe software for clinical note transcription with the patient, who gave verbal consent to proceed.  History of Present Illness   LUX SKILTON Amy is a 57 year old female who presents with neck and shoulder pain.  She has been experiencing severe neck pain for the past month, initially attributing it to prolonged computer use. The pain began as an ache in the neck and has progressed to include the shoulders, particularly the trapezius muscles, over the past week. She describes the pain as feeling like 'somebody's punched you and like you're bruised.'  The pain does not radiate past the shoulders, and there is no numbness or tingling in the hands. She reports that she feels pain when she walks, but she is able to continue walking without needing to stop due to discomfort. She has been using hot and cold compresses, massage, stretches, and ibuprofen  as needed, but finds that the relief is temporary.  She has been involved in physically demanding activities, such as scraping paint from a hundred-year-old house, which she believes may contribute to her shoulder discomfort. No recent injuries or falls.  She reports experiencing night sweats twice last week, which she has not experienced before. She had a hysterectomy over twenty years ago, with her ovaries removed in 2006 or 2007, and has been on estrogen therapy since then. She is concerned about the recent onset of night sweats and its potential relation to her estrogen levels.       Review of Systems     Objective:   Physical Exam General-in no acute distress Eyes-no discharge Lungs-respiratory rate normal, CTA CV-no murmurs,RRR Extremities skin warm dry no edema Neuro grossly normal Behavior normal, alert Reflexes brisk in the left and right lower leg but  not pathologic Strength in legs are good Strength in arms are good Reflexes in the arms normal        Assessment & Plan:  Neck and upper back myofascial pain Chronic pain likely from muscle strain due to prolonged computer use and physical activity. No signs of spinal cord compression. Differential includes muscle strain and cervical spine issues. - Recommend stretching exercises 15-20 minutes every other day. - Prescribe Aleve (naproxen) 220 mg, two tablets morning and evening for 7 days, then as needed. - Advise against long-term NSAID use due to ulcer and kidney risk. - Monitor for spinal cord compression signs: burning arm pain, severe pain, muscle weakness. - Consider cervical spine x-rays and MRI if symptoms persist beyond 6-12 weeks. Patient to give us  update in the course of the next 3 to 4 weeks May need x-rays If persistent beyond 10 to 12 weeks may need MRI  Menopausal symptoms on estrogen replacement therapy Night sweats possibly due to low estrogen levels post-hysterectomy and oophorectomy. Discussed risks of long-term estrogen use, including blood clots, breast cancer, and dementia. Advised tapering off estrogen by age 3. - Consider adjusting estrogen patch dose to manage night sweats. - Discuss risks of long-term estrogen use: blood clots, dementia. - Plan to taper off estrogen by age 48 to minimize dementia risk.

## 2023-12-13 NOTE — Telephone Encounter (Signed)
 FYI Only or Action Required?: FYI only for provider.  Patient was last seen in primary care on 08/30/2023 by Alphonsa Glendia LABOR, MD.  Called Nurse Triage reporting Pain.  Symptoms began x 2 weeks.  Interventions attempted: Nothing.  Symptoms are: gradually worsening.  Triage Disposition: See PCP When Office is Open (Within 3 Days)  Patient/caregiver understands and will follow disposition?: Yes   Copied from CRM #[id number]. Topic: Clinical - Red Word Triage >> Dec 13, 2023  7:51 AM Larissa S wrote: [facility] that prompted transfer to Nurse Triage: joint pain Reason for Disposition  [1] MODERATE pain (e.g., interferes with normal activities) AND [2] present > 3 days  Answer Assessment - Initial Assessment Questions 1. ONSET: When did the muscle aches or body pains start?      X 2 weeks 2. LOCATION: What part of your body is hurting? (e.g., entire body, arms, legs)      Bilateral hips and neck  3. SEVERITY: How bad is the pain? (Scale 1-10; or mild, moderate, severe)     5 to 6/10 04. CAUSE: What do you think is causing the pains?     Unknown; maybe sciatica 5. FEVER: Do you have a fever? If Yes, ask: What is your temperature, how was it measured, and  when did it start?      no 6. OTHER SYMPTOMS: Do you have any other symptoms? (e.g., chest pain, cold or flu symptoms, rash, weakness, weight loss)     no 7. PREGNANCY: Is there any chance you are pregnant? When was your last menstrual period?     na 8. TRAVEL: Have you traveled out of the country in the last month? (e.g., exposures, travel history)     no  Protocols used: Muscle Aches and Body Pain-A-AH

## 2024-01-08 ENCOUNTER — Ambulatory Visit
Admission: RE | Admit: 2024-01-08 | Discharge: 2024-01-08 | Disposition: A | Source: Ambulatory Visit | Attending: Family Medicine | Admitting: Family Medicine

## 2024-01-08 DIAGNOSIS — N6320 Unspecified lump in the left breast, unspecified quadrant: Secondary | ICD-10-CM

## 2024-01-08 DIAGNOSIS — M7989 Other specified soft tissue disorders: Secondary | ICD-10-CM

## 2024-01-09 ENCOUNTER — Other Ambulatory Visit: Payer: Self-pay | Admitting: Family Medicine

## 2024-01-09 DIAGNOSIS — N6321 Unspecified lump in the left breast, upper outer quadrant: Secondary | ICD-10-CM

## 2024-01-09 DIAGNOSIS — N6489 Other specified disorders of breast: Secondary | ICD-10-CM

## 2024-03-01 ENCOUNTER — Encounter: Payer: Self-pay | Admitting: Nurse Practitioner

## 2024-03-01 ENCOUNTER — Ambulatory Visit: Admitting: Nurse Practitioner

## 2024-03-01 VITALS — BP 116/77 | HR 71 | Ht 65.0 in | Wt 155.4 lb

## 2024-03-01 DIAGNOSIS — Z9223 Personal history of estrogen therapy: Secondary | ICD-10-CM

## 2024-03-01 DIAGNOSIS — F419 Anxiety disorder, unspecified: Secondary | ICD-10-CM

## 2024-03-01 DIAGNOSIS — I10 Essential (primary) hypertension: Secondary | ICD-10-CM | POA: Diagnosis not present

## 2024-03-01 DIAGNOSIS — E785 Hyperlipidemia, unspecified: Secondary | ICD-10-CM

## 2024-03-01 DIAGNOSIS — Z780 Asymptomatic menopausal state: Secondary | ICD-10-CM | POA: Diagnosis not present

## 2024-03-01 NOTE — Assessment & Plan Note (Signed)
-  Educated patient about diet sources of estrogen such as soy and flaxseed to help with hot flashes. Recommended black cohosh supplement. Patient agrees with plan and wants to look at natural sources before medication.  -Educated patient about Veozah and to research medication at home. Patient will message on MyChart if interested in starting medication. Educated patient that she will need serial labs to monitor liver enzymes if she decides to start medication. Patient had last liver enzyme labs on 09/07/23. Agrees with plan.

## 2024-03-01 NOTE — Progress Notes (Unsigned)
   Subjective:    Patient ID: Angela Salas, female    DOB: 1965/09/25, 58 y.o.   MRN: [id number]  HPI Tal presents today for a follow-up for her blood pressure and hot flashes. She is taking her blood pressure medication as prescribed, and denies any concerns. Reports that she stopped her estrogen therapy after appointment on 12/13/23 due to family history of dementia. Is complaining of hot flashes, and asking for medication or supplement recommendations.   Review of Systems  Constitutional:  Negative for fatigue and fever.  Respiratory:  Negative for cough, chest tightness, shortness of breath and wheezing.   Cardiovascular:  Negative for chest pain and leg swelling.  Neurological:  Negative for dizziness and headaches.  Psychiatric/Behavioral:  Negative for sleep disturbance.       Objective:   Physical Exam Constitutional:      General: She is not in acute distress.    Appearance: Normal appearance. She is not ill-appearing.  Cardiovascular:     Rate and Rhythm: Normal rate and regular rhythm.     Heart sounds: Normal heart sounds. No murmur heard. Pulmonary:     Effort: Pulmonary effort is normal. No respiratory distress.     Breath sounds: Normal breath sounds. No wheezing.  Neurological:     Mental Status: She is alert.  Psychiatric:        Mood and Affect: Mood normal.        Behavior: Behavior normal.        Thought Content: Thought content normal.        Judgment: Judgment normal.    Today's Vitals   03/01/24 0816  BP: 116/77  Pulse: 71  SpO2: 97%  Weight: 155 lb 6 oz (70.5 kg)  Height: 5' 5 (1.651 m)   Body mass index is 25.86 kg/m.      Assessment & Plan:  1. Menopause (Primary) -Educated patient about natural sources of estrogen such as soy and flaxseed to help with hot flashes. Recommended black cohosh supplement. Patient agrees with plan and wants to look at natural sources before medication.  -Educated patient about Veozah and to research  medication at home. Patient will message on MyChart if interested in starting medication. Educated patient that she will need serial labs to monitor liver enzymes if she decides to start medication. Patient had last liver enzyme labs on 09/07/23. Agrees with plan.   2. Primary hypertension Continue current regimen.   - hydrochlorothiazide  (HYDRODIURIL ) 25 MG tablet; TAKE 1 TABLET(25 MG) BY MOUTH EVERY MORNING  Dispense: 90 tablet; Refill: 1 - potassium chloride  (KLOR-CON ) 10 MEQ tablet; TAKE 1 TABLET BY MOUTH EVERY DAY  Dispense: 90 tablet; Refill: 1  3. History of estrogen therapy Try natural supplements as directed for hot flashes.  4. Anxiety Continue Sertraline . - sertraline  (ZOLOFT ) 50 MG tablet; Take one tab po qd  Dispense: 90 tablet; Refill: 1  5. Hyperlipidemia, unspecified hyperlipidemia type  - rosuvastatin  (CRESTOR ) 5 MG tablet; TAKE 1 TABLET BY MOUTH AT BEDTIME  Dispense: 90 tablet; Refill: 1  Recommend physical and yearly labs next May. Return in about 6 months (around 08/29/2024).

## 2024-03-01 NOTE — Patient Instructions (Signed)
 Veozah medication  Soy, flaxseed, black cohosh  Estrovel

## 2024-03-02 ENCOUNTER — Encounter: Payer: Self-pay | Admitting: Nurse Practitioner

## 2024-03-02 MED ORDER — SERTRALINE HCL 50 MG PO TABS: ORAL_TABLET | ORAL | 1 refills | Status: AC

## 2024-03-02 MED ORDER — POTASSIUM CHLORIDE ER 10 MEQ PO TBCR: EXTENDED_RELEASE_TABLET | ORAL | 1 refills | Status: AC

## 2024-03-02 MED ORDER — ROSUVASTATIN CALCIUM 5 MG PO TABS: ORAL_TABLET | ORAL | 1 refills | Status: AC

## 2024-03-02 MED ORDER — HYDROCHLOROTHIAZIDE 25 MG PO TABS: ORAL_TABLET | ORAL | 1 refills | Status: AC

## 2024-07-09 ENCOUNTER — Other Ambulatory Visit

## 2024-07-09 ENCOUNTER — Encounter

## 2024-08-29 ENCOUNTER — Ambulatory Visit: Admitting: Nurse Practitioner
